# Patient Record
Sex: Male | Born: 1995 | Race: White | Hispanic: No | Marital: Single | Smoking: Current some day smoker
Health system: Southern US, Community
[De-identification: ages and names within clinical notes are randomized; demographics above are authoritative.]

## PROBLEM LIST (undated history)

## (undated) DIAGNOSIS — S060XAA Concussion with loss of consciousness status unknown, initial encounter: Secondary | ICD-10-CM

## (undated) DIAGNOSIS — S42009A Fracture of unspecified part of unspecified clavicle, initial encounter for closed fracture: Secondary | ICD-10-CM

## (undated) DIAGNOSIS — S060X9A Concussion with loss of consciousness of unspecified duration, initial encounter: Secondary | ICD-10-CM

## (undated) HISTORY — DX: Concussion with loss of consciousness of unspecified duration, initial encounter: S06.0X9A

## (undated) HISTORY — PX: CIRCUMCISION: SUR203

## (undated) HISTORY — DX: Concussion with loss of consciousness status unknown, initial encounter: S06.0XAA

## (undated) HISTORY — DX: Fracture of unspecified part of unspecified clavicle, initial encounter for closed fracture: S42.009A

---
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
## 2017-10-07 NOTE — Progress Notes (Deleted)
   Subjective:    Patient ID: Dillon Carrillo, male    DOB: 01-20-1996, 22 y.o.   MRN: [id number]  HPI:  Dillon Carrillo is here to establish as a new pt.  He is a pleasant 22 year old male.  PMH:    Review of Systems     Objective:   Physical Exam        Assessment & Plan:

## 2017-10-08 ENCOUNTER — Ambulatory Visit: Payer: 59 | Admitting: Adult Health

## 2017-11-13 ENCOUNTER — Encounter: Payer: Self-pay | Admitting: Adult Health

## 2017-11-13 ENCOUNTER — Ambulatory Visit (INDEPENDENT_AMBULATORY_CARE_PROVIDER_SITE_OTHER): Payer: No Typology Code available for payment source | Admitting: Adult Health

## 2017-11-13 VITALS — BP 120/80 | HR 57 | Ht 70.0 in | Wt 199.6 lb

## 2017-11-13 DIAGNOSIS — Z118 Encounter for screening for other infectious and parasitic diseases: Secondary | ICD-10-CM | POA: Diagnosis not present

## 2017-11-13 DIAGNOSIS — Z0000 Encounter for general adult medical examination without abnormal findings: Secondary | ICD-10-CM | POA: Insufficient documentation

## 2017-11-13 NOTE — Assessment & Plan Note (Signed)
Urine specimen collected today

## 2017-11-13 NOTE — Assessment & Plan Note (Signed)
Increase water intake, strive for at least 100 ounces/day.   Follow Heart Healthy diet Increase regular exercise.  Recommend at least 30 minutes daily, 5 days per week of walking, jogging, biking, swimming, YouTube/Pinterest workout videos. Recommend annual physical.
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
## 2017-11-13 NOTE — Patient Instructions (Addendum)
Heart-Healthy Eating Plan Heart-healthy meal planning includes:  Limiting unhealthy fats.  Increasing healthy fats.  Making other small dietary changes.  You may need to talk with your doctor or a diet specialist (dietitian) to create an eating plan that is right for you. What types of fat should I choose?  Choose healthy fats. These include olive oil and canola oil, flaxseeds, walnuts, almonds, and seeds.  Eat more omega-3 fats. These include salmon, mackerel, sardines, tuna, flaxseed oil, and ground flaxseeds. Try to eat fish at least twice each week.  Limit saturated fats. ? Saturated fats are often found in animal products, such as meats, butter, and cream. ? Plant sources of saturated fats include palm oil, palm kernel oil, and coconut oil.  Avoid foods with partially hydrogenated oils in them. These include stick margarine, some tub margarines, cookies, crackers, and other baked goods. These contain trans fats. What general guidelines do I need to follow?  Check food labels carefully. Identify foods with trans fats or high amounts of saturated fat.  Fill one half of your plate with vegetables and green salads. Eat 4-5 servings of vegetables per day. A serving of vegetables is: ? 1 cup of raw leafy vegetables. ?  cup of raw or cooked cut-up vegetables. ?  cup of vegetable juice.  Fill one fourth of your plate with whole grains. Look for the word "whole" as the first word in the ingredient list.  Fill one fourth of your plate with lean protein foods.  Eat 4-5 servings of fruit per day. A serving of fruit is: ? One medium whole fruit. ?  cup of dried fruit. ?  cup of fresh, frozen, or canned fruit. ?  cup of 100% fruit juice.  Eat more foods that contain soluble fiber. These include apples, broccoli, carrots, beans, peas, and barley. Try to get 20-30 g of fiber per day.  Eat more home-cooked food. Eat less restaurant, buffet, and fast food.  Limit or avoid  alcohol.  Limit foods high in starch and sugar.  Avoid fried foods.  Avoid frying your food. Try baking, boiling, grilling, or broiling it instead. You can also reduce fat by: ? Removing the skin from poultry. ? Removing all visible fats from meats. ? Skimming the fat off of stews, soups, and gravies before serving them. ? Steaming vegetables in water or broth.  Lose weight if you are overweight.  Eat 4-5 servings of nuts, legumes, and seeds per week: ? One serving of dried beans or legumes equals  cup after being cooked. ? One serving of nuts equals 1 ounces. ? One serving of seeds equals  ounce or one tablespoon.  You may need to keep track of how much salt or sodium you eat. This is especially true if you have high blood pressure. Talk with your doctor or dietitian to get more information. What foods can I eat? Grains Breads, including French, white, pita, wheat, raisin, rye, oatmeal, and Italian. Tortillas that are neither fried nor made with lard or trans fat. Low-fat rolls, including hotdog and hamburger buns and English muffins. Biscuits. Muffins. Waffles. Pancakes. Light popcorn. Whole-grain cereals. Flatbread. Melba toast. Pretzels. Breadsticks. Rusks. Low-fat snacks. Low-fat crackers, including oyster, saltine, matzo, graham, animal, and rye. Rice and pasta, including brown rice and pastas that are made with whole wheat. Vegetables All vegetables. Fruits All fruits, but limit coconut. Meats and Other Protein Sources Lean, well-trimmed beef, veal, pork, and lamb. Chicken and turkey without skin. All fish and shellfish.   Wild duck, rabbit, pheasant, and venison. Egg whites or low-cholesterol egg substitutes. Dried beans, peas, lentils, and tofu. Seeds and most nuts. Dairy Low-fat or nonfat cheeses, including ricotta, string, and mozzarella. Skim or 1% milk that is liquid, powdered, or evaporated. Buttermilk that is made with low-fat milk. Nonfat or low-fat  yogurt. Beverages Mineral water. Diet carbonated beverages. Sweets and Desserts Sherbets and fruit ices. Honey, jam, marmalade, jelly, and syrups. Meringues and gelatins. Pure sugar candy, such as hard candy, jelly beans, gumdrops, mints, marshmallows, and small amounts of dark chocolate. [employer] M[employer] food cake. Eat all sweets and desserts in moderation. Fats and Oils Nonhydrogenated (trans-free) margarines. Vegetable oils, including soybean, sesame, sunflower, olive, peanut, safflower, corn, canola, and cottonseed. Salad dressings or mayonnaise made with a vegetable oil. Limit added fats and oils that you use for cooking, baking, salads, and as spreads. Other Cocoa powder. Coffee and tea. All seasonings and condiments. The items listed above may not be a complete list of recommended foods or beverages. Contact your dietitian for more options. What foods are not recommended? Grains Breads that are made with saturated or trans fats, oils, or whole milk. Croissants. Butter rolls. Cheese breads. Sweet rolls. Donuts. Buttered popcorn. Chow mein noodles. High-fat crackers, such as cheese or butter crackers. Meats and Other Protein Sources Fatty meats, such as hotdogs, short ribs, sausage, spareribs, bacon, rib eye roast or steak, and mutton. High-fat deli meats, such as salami and bologna. Caviar. Domestic duck and goose. Organ meats, such as kidney, liver, sweetbreads, and heart. Dairy Cream, sour cream, cream cheese, and creamed cottage cheese. Whole-milk cheeses, including blue (bleu), [address], [place], [place], [address], [place], [address], cheddar, [place], and [place]. Whole or 2% milk that is liquid, evaporated, or condensed. Whole buttermilk. Cream sauce or high-fat cheese sauce. Yogurt that is made from whole milk. Beverages Regular sodas and juice drinks with added sugar. Sweets and Desserts Frosting. Pudding. Cookies. Cakes other than angel food cake. Candy that has milk chocolate or white  chocolate, hydrogenated fat, butter, coconut, or unknown ingredients. Buttered syrups. Full-fat ice cream or ice cream drinks. Fats and Oils Gravy that has suet, meat fat, or shortening. Cocoa butter, hydrogenated oils, palm oil, coconut oil, palm kernel oil. These can often be found in baked products, candy, fried foods, nondairy creamers, and whipped toppings. Solid fats and shortenings, including bacon fat, salt pork, lard, and butter. Nondairy cream substitutes, such as coffee creamers and sour cream substitutes. Salad dressings that are made of unknown oils, cheese, or sour cream. The items listed above may not be a complete list of foods and beverages to avoid. Contact your dietitian for more information. This information is not intended to replace advice given to you by your health care provider. Make sure you discuss any questions you have with your health care provider. Document Released: 02/18/2012 Document Revised: 01/25/2016 Document Reviewed: 02/10/2014 Elsevier Interactive Patient Education  2018 [employer]   Increase water intake, strive for at least 100 ounces/day.   Follow Heart Healthy diet Increase regular exercise.  Recommend at least 30 minutes daily, 5 days per week of walking, jogging, biking, swimming, YouTube/Pinterest workout videos. Recommend annual physical. WELCOME TO THE PRACTICE!

## 2017-11-16 LAB — CHLAMYDIA TRACHOMATIS, DNA, AMP PROBE: Chlamydia trachomatis, NAA: NEGATIVE

## 2017-11-17 NOTE — Progress Notes (Signed)
Pt called but the call was not able to go through because the number could not be reach...wil retry later. Idelle JoW. Linus Weckerly, CMA

## 2017-12-02 ENCOUNTER — Encounter: Payer: Self-pay | Admitting: Adult Health

## 2017-12-02 ENCOUNTER — Ambulatory Visit (INDEPENDENT_AMBULATORY_CARE_PROVIDER_SITE_OTHER): Payer: No Typology Code available for payment source | Admitting: Adult Health

## 2017-12-02 ENCOUNTER — Other Ambulatory Visit: Payer: Self-pay | Admitting: Adult Health

## 2017-12-02 VITALS — BP 107/67 | HR 68 | Temp 97.8°F | Ht 70.0 in | Wt 196.1 lb

## 2017-12-02 DIAGNOSIS — R1011 Right upper quadrant pain: Secondary | ICD-10-CM | POA: Insufficient documentation

## 2017-12-02 DIAGNOSIS — Z7251 High risk heterosexual behavior: Secondary | ICD-10-CM

## 2017-12-02 DIAGNOSIS — R1013 Epigastric pain: Secondary | ICD-10-CM

## 2017-12-02 NOTE — Assessment & Plan Note (Signed)
Drawn today: CBC STI screening US Abdomen complete Instructed to seek immediate medical care if "Red Flag" sx's develop.

## 2017-12-02 NOTE — Progress Notes (Addendum)
Subjective:    Patient ID: Dillon Carrillo, male    DOB: [birth year], 22 y.o.   MRN: [id number]  HPI:  Dillon Carrillo presents with abdominal pain that developed yesterday morning.  Pain was initially epigastric and is now RUQ/RLQ and is constant, described as a "pulled muscle feeling" and rated 2/10.  He denies fever/night sweats/N/V/D/poor appetite. He denies change in bowel/bladder habits, consistently having at least one BM daily. He denies blood in urine/stool. He reports last unprotected sexual encounter was Nov 2018. He denies inguinal pain, penile discharge, or dysuria. He has not taken anything for pain. He denies any abdominal surgeries.  Patient Care Team    Relationship Specialty Notifications Start End  Julaine Fusi, NP PCP - General Family Medicine  11/13/17     Patient Active Problem List   Diagnosis Date Noted  . Screening for chlamydial disease 11/13/2017  . Healthcare maintenance 11/13/2017     Past Medical History:  Diagnosis Date  . Clavicle fracture    left  . Concussion      Past Surgical History:  Procedure Laterality Date  . CIRCUMCISION       Family History  Problem Relation Age of Onset  . Hypertension Mother   . Healthy Father   . Healthy Sister   . Healthy Brother   . Healthy Brother      Social History   Substance and Sexual Activity  Drug Use Yes  . Types: Marijuana     Social History   Substance and Sexual Activity  Alcohol Use Yes   Comment: every couple of months     Social History   Tobacco Use  Smoking Status Never Smoker  Smokeless Tobacco Never Used     No outpatient encounter medications on file as of 12/02/2017.   No facility-administered encounter medications on file as of 12/02/2017.     Allergies: Penicillins  Body mass index is 28.14 kg/m.  Blood pressure 107/67, pulse 68, temperature 97.8 F (36.6 C), temperature source Oral, height 5\' 10"  (1.778 m), weight 196 lb 1.6 oz  (89 kg), SpO2 100 %.     Review of Systems  Constitutional: Negative for activity change, appetite change, chills, diaphoresis, fatigue, fever and unexpected weight change.  Eyes: Negative for visual disturbance.  Respiratory: Negative for cough, chest tightness, shortness of breath, wheezing and stridor.   Cardiovascular: Negative for chest pain, palpitations and leg swelling.  Gastrointestinal: Positive for abdominal pain. Negative for abdominal distention, blood in stool, constipation, diarrhea, nausea and vomiting.  Endocrine: Negative for cold intolerance, heat intolerance, polydipsia, polyphagia and polyuria.  Genitourinary: Negative for decreased urine volume, difficulty urinating, discharge, flank pain, frequency, genital sores, hematuria, penile pain, penile swelling, scrotal swelling, testicular pain and urgency.  Neurological: Positive for headaches. Negative for dizziness.       Objective:   Physical Exam  Constitutional: He is oriented to person, place, and time. He appears well-developed and well-nourished. No distress.  HENT:  Head: Normocephalic and atraumatic.  Right Ear: External ear normal.  Left Ear: External ear normal.  Eyes: Pupils are equal, round, and reactive to light. Conjunctivae are normal.  Cardiovascular: Normal rate, regular rhythm, normal heart sounds and intact distal pulses.  No murmur heard. Pulmonary/Chest: Effort normal and breath sounds normal. No respiratory distress. He has no wheezes. He has no rales. He exhibits no tenderness.  Abdominal: Soft. Bowel sounds are normal. He exhibits no distension and no mass. There is no hepatosplenomegaly. There is tenderness  in the right upper quadrant, right lower quadrant and epigastric area. There is no rigidity, no rebound, no guarding, no CVA tenderness, no tenderness at McBurney's point and negative Murphy's sign.  Neurological: He is alert and oriented to person, place, and time.  Skin: Skin is warm and  dry. No rash noted. He is not diaphoretic. No erythema. No pallor.  Psychiatric: He has a normal mood and affect. His behavior is normal. Judgment and thought content normal.  Nursing note and vitals reviewed.     Assessment & Plan:   1. High risk heterosexual behavior   2. Epigastric abdominal pain   3. RUQ pain     RUQ pain Drawn today: CBC STI screening US Abdomen complete Instructed to seek immediate medical care if "Red Flag" sx's develop.  Epigastric abdominal pain Drawn today: CBC STI screening US Abdomen complete Instructed to seek immediate medical care if "Red Flag" sx's develop.    FOLLOW-UP:  Return if symptoms worsen or fail to improve. Pt was in the office today for 25+ minutes, with over 50% time spent in face to face counseling of patient's various medical conditions and in coordination of care

## 2017-12-02 NOTE — Assessment & Plan Note (Signed)
Drawn today: CBC STI screening US Abdomen complete Instructed to seek immediate medical care if "Red Flag" sx's develop. 

## 2017-12-02 NOTE — Patient Instructions (Signed)
Abdominal Pain, Adult Abdominal pain can be caused by many things. Often, abdominal pain is not serious and it gets better with no treatment or by being treated at home. However, sometimes abdominal pain is serious. Your health care provider will do a medical history and a physical exam to try to determine the cause of your abdominal pain. Follow these instructions at home:  Take over-the-counter and prescription medicines only as told by your health care provider. Do not take a laxative unless told by your health care provider.  Drink enough fluid to keep your urine clear or pale yellow.  Watch your condition for any changes.  Keep all follow-up visits as told by your health care provider. This is important. Contact a health care provider if:  Your abdominal pain changes or gets worse.  You are not hungry or you lose weight without trying.  You are constipated or have diarrhea for more than 2-3 days.  You have pain when you urinate or have a bowel movement.  Your abdominal pain wakes you up at night.  Your pain gets worse with meals, after eating, or with certain foods.  You are throwing up and cannot keep anything down.  You have a fever. Get help right away if:  Your pain does not go away as soon as your health care provider told you to expect.  You cannot stop throwing up.  Your pain is only in areas of the abdomen, such as the right side or the left lower portion of the abdomen.  You have bloody or black stools, or stools that look like tar.  You have severe pain, cramping, or bloating in your abdomen.  You have signs of dehydration, such as: ? Dark urine, very little urine, or no urine. ? Cracked lips. ? Dry mouth. ? Sunken eyes. ? Sleepiness. ? Weakness. This information is not intended to replace advice given to you by your health care provider. Make sure you discuss any questions you have with your health care provider. Document Released: 05/29/2005 Document  Revised: 03/08/2016 Document Reviewed: 01/31/2016 Elsevier Interactive Patient Education  [employer]2018 Elsevier Inc.   We will call you when lab results are available. Someone will call you to schedule abdominal ultrasound. Please call clinic with any questions/concerns. FEEL BETTER!

## 2017-12-03 LAB — HIV ANTIBODY (ROUTINE TESTING W REFLEX): HIV Screen 4th Generation wRfx: NONREACTIVE

## 2017-12-03 LAB — CBC WITH DIFFERENTIAL/PLATELET
BASOS ABS: 0 10*3/uL (ref 0.0–0.2)
Basos: 0 %
EOS (ABSOLUTE): 0.1 10*3/uL (ref 0.0–0.4)
Eos: 2 %
HEMOGLOBIN: 14.7 g/dL (ref 13.0–17.7)
Hematocrit: 42.6 % (ref 37.5–51.0)
IMMATURE GRANS (ABS): 0 10*3/uL (ref 0.0–0.1)
IMMATURE GRANULOCYTES: 0 %
LYMPHS: 41 %
Lymphocytes Absolute: 2.1 10*3/uL (ref 0.7–3.1)
MCH: 31.7 pg (ref 26.6–33.0)
MCHC: 34.5 g/dL (ref 31.5–35.7)
MCV: 92 fL (ref 79–97)
MONOCYTES: 11 %
Monocytes Absolute: 0.5 10*3/uL (ref 0.1–0.9)
NEUTROS ABS: 2.4 10*3/uL (ref 1.4–7.0)
NEUTROS PCT: 46 %
PLATELETS: 251 10*3/uL (ref 150–379)
RBC: 4.63 x10E6/uL (ref 4.14–5.80)
RDW: 13.6 % (ref 12.3–15.4)
WBC: 5.1 10*3/uL (ref 3.4–10.8)

## 2017-12-03 LAB — RPR: RPR: NONREACTIVE

## 2017-12-04 ENCOUNTER — Ambulatory Visit
Admission: RE | Admit: 2017-12-04 | Discharge: 2017-12-04 | Disposition: A | Discharge status: Home / Self Care | Payer: No Typology Code available for payment source | Source: Ambulatory Visit | Attending: Adult Health | Admitting: Adult Health

## 2017-12-04 DIAGNOSIS — R1011 Right upper quadrant pain: Secondary | ICD-10-CM | POA: Insufficient documentation

## 2017-12-04 DIAGNOSIS — R1013 Epigastric pain: Secondary | ICD-10-CM | POA: Diagnosis not present

## 2017-12-04 LAB — CHLAMYDIA/GONOCOCCUS/TRICHOMONAS, NAA
CHLAMYDIA BY NAA: NEGATIVE
GONOCOCCUS BY NAA: NEGATIVE
TRICH VAG BY NAA: NEGATIVE

## 2018-01-09 ENCOUNTER — Encounter: Payer: Self-pay | Admitting: Family Medicine

## 2018-01-09 ENCOUNTER — Ambulatory Visit (INDEPENDENT_AMBULATORY_CARE_PROVIDER_SITE_OTHER): Payer: Self-pay | Admitting: Family Medicine

## 2018-01-09 VITALS — BP 120/60 | HR 86 | Temp 97.8°F | Wt 198.0 lb

## 2018-01-09 DIAGNOSIS — J9801 Acute bronchospasm: Secondary | ICD-10-CM

## 2018-01-09 DIAGNOSIS — J019 Acute sinusitis, unspecified: Secondary | ICD-10-CM

## 2018-01-09 MED ORDER — ALBUTEROL SULFATE 108 (90 BASE) MCG/ACT IN AEPB: 2.0000 | INHALATION_SPRAY | Freq: Four times a day (QID) | RESPIRATORY_TRACT | 0 refills | Status: DC | PRN

## 2018-01-09 MED ORDER — DOXYCYCLINE HYCLATE 100 MG PO CAPS: 100.0000 mg | ORAL_CAPSULE | Freq: Two times a day (BID) | ORAL | 0 refills | Status: DC

## 2018-01-09 MED ORDER — LEVOCETIRIZINE DIHYDROCHLORIDE 5 MG PO TABS: 5.0000 mg | ORAL_TABLET | Freq: Every evening | ORAL | 0 refills | Status: DC

## 2018-01-09 MED ORDER — BENZONATATE 100 MG PO CAPS: 100.0000 mg | ORAL_CAPSULE | Freq: Three times a day (TID) | ORAL | 0 refills | Status: DC | PRN

## 2018-01-09 NOTE — Progress Notes (Signed)
Patient ID: Dillon Carrillo, male    DOB: 1996/06/08, 22 y.o.   MRN: [id number]  PCP: Julaine Fusi, NP  Chief Complaint  Patient presents with  . focus-cough    Subjective:  HPI Dillon Carrillo is a 22 y.o. male presents for evaluation worsening cough x 3 weeks. Cyle is a current smoker. Reports associated symptoms include throat discomfort, nasal drainage and congestion, and non-productive persistent cough. Reports no known history of asthma or allergies. His profession requires him to work outside daily and he feels this is exacerbated symptoms. He starting taking an allergy medication yesterday which hasn't improved symptoms. Denies fever, dizziness, chest tightness, or body aches. Social History   Socioeconomic History  . Marital status: Single    Spouse name: Not on file  . Number of children: Not on file  . Years of education: Not on file  . Highest education level: Not on file  Occupational History  . Not on file  Social Needs  . Financial resource strain: Not on file  . Food insecurity:    Worry: Not on file    Inability: Not on file  . Transportation needs:    Medical: Not on file    Non-medical: Not on file  Tobacco Use  . Smoking status: Never Smoker  . Smokeless tobacco: Never Used  Substance and Sexual Activity  . Alcohol use: Yes    Comment: every couple of months  . Drug use: Yes    Types: Marijuana  . Sexual activity: Not Currently  Lifestyle  . Physical activity:    Days per week: Not on file    Minutes per session: Not on file  . Stress: Not on file  Relationships  . Social connections:    Talks on phone: Not on file    Gets together: Not on file    Attends religious service: Not on file    Active member of club or organization: Not on file    Attends meetings of clubs or organizations: Not on file    Relationship status: Not on file  . Intimate partner violence:    Fear of current or ex partner: Not on file   Emotionally abused: Not on file    Physically abused: Not on file    Forced sexual activity: Not on file  Other Topics Concern  . Not on file  Social History Narrative  . Not on file    Family History  Problem Relation Age of Onset  . Hypertension Mother   . Healthy Father   . Healthy Sister   . Healthy Brother   . Healthy Brother    Review of Systems  Pertinent negatives listed in HPI   Patient Active Problem List   Diagnosis Date Noted  . Epigastric abdominal pain 12/02/2017  . RUQ pain 12/02/2017  . Screening for chlamydial disease 11/13/2017  . Healthcare maintenance 11/13/2017    Allergies  Allergen Reactions  . Penicillins Rash and Swelling    fever     Prior to Admission medications   Not on File    Past Medical, Surgical Family and Social History reviewed and updated.    Objective:   Today's Vitals   01/09/18 1048  BP: 120/60  Pulse: 86  Temp: 97.8 F (36.6 C)  SpO2: 98%  Weight: 198 lb (89.8 kg)    Wt Readings from Last 3 Encounters:  01/09/18 198 lb (89.8 kg) (92 %, Z= 1.38)*  12/02/17 196 lb 1.6 oz (89 kg) (  91 %, Z= 1.34)*  11/13/17 199 lb 9.6 oz (90.5 kg) (92 %, Z= 1.43)*   * Growth percentiles are based on CDC (Boys, 2-20 Years) data.   Physical Exam  Constitutional: He is oriented to person, place, and time. He appears well-developed and well-nourished.  HENT:  Head: Normocephalic and atraumatic.  Right Ear: External ear normal.  Left Ear: External ear normal.  Nose: Mucosal edema and rhinorrhea present.  Mouth/Throat: Posterior oropharyngeal erythema present. No oropharyngeal exudate, posterior oropharyngeal edema or tonsillar abscesses.  Cardiovascular: Normal rate, regular rhythm, normal heart sounds and intact distal pulses.  Pulmonary/Chest: Effort normal and breath sounds normal.  Neurological: He is alert and oriented to person, place, and time.  Psychiatric: He has a normal mood and affect. His behavior is normal. Judgment  and thought content normal.   Assessment & Plan:  1. Acute sinusitis, recurrence not specified, unspecified location 2. Cough due to bronchospasm Start Levocetirizine 5 mg daily at bedtime for allergy management. Take Doxycycline 100 mg twice daily x 10 day to treat sinusitis. 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 You may also take Benzonatate 100-200 mg up to 3 times daily as needed for cough. Strongly encouraged smoking cessation.   Meds ordered this encounter  Medications  . doxycycline (VIBRAMYCIN) 100 MG capsule    Sig: Take 1 capsule (100 mg total) by mouth 2 (two) times daily.    Dispense:  20 capsule    Refill:  0  . Albuterol Sulfate (PROAIR RESPICLICK) 108 (90 Base) MCG/ACT AEPB    Sig: Inhale 2 puffs into the lungs every 6 (six) hours as needed (cough, shortness of breath, wheezing).    Dispense:  1 each    Refill:  0  . levocetirizine (XYZAL) 5 MG tablet    Sig: Take 1 tablet (5 mg total) by mouth every evening.    Dispense:  60 tablet    Refill:  0  . benzonatate (TESSALON) 100 MG capsule    Sig: Take 1-2 capsules (100-200 mg total) by mouth 3 (three) times daily as needed for cough.    Dispense:  40 capsule    Refill:  0     If symptoms worsen or do not improve, return for follow-up, follow-up with PCP, or at the emergency department if severity of symptoms warrant a higher level of care.     Godfrey Pick. Tiburcio Pea, MSN, FNP-C [facility]  [address]  [place], [place] [address] [phone]

## 2018-01-09 NOTE — Patient Instructions (Signed)
Take all medication as directed. I recommend that you discontinue smoking as this will only worsen your symptoms. Follow-up with your PCP if symptoms worsen or do not improve.    Cough, Adult A cough helps to clear your throat and lungs. A cough may last only 2-3 weeks (acute), or it may last longer than 8 weeks (chronic). Many different things can cause a cough. A cough may be a sign of an illness or another medical condition. Follow these instructions at home:  Pay attention to any changes in your cough.  Take medicines only as told by your doctor. ? If you were prescribed an antibiotic medicine, take it as told by your doctor. Do not stop taking it even if you start to feel better. ? Talk with your doctor before you try using a cough medicine.  Drink enough fluid to keep your pee (urine) clear or pale yellow.  If the air is dry, use a cold steam vaporizer or humidifier in your home.  Stay away from things that make you cough at work or at home.  If your cough is worse at night, try using extra pillows to raise your head up higher while you sleep.  Do not smoke, and try not to be around smoke. If you need help quitting, ask your doctor.  Do not have caffeine.  Do not drink alcohol.  Rest as needed. Contact a doctor if:  You have new problems (symptoms).  You cough up yellow fluid (pus).  Your cough does not get better after 2-3 weeks, or your cough gets worse.  Medicine does not help your cough and you are not sleeping well.  You have pain that gets worse or pain that is not helped with medicine.  You have a fever.  You are losing weight and you do not know why.  You have night sweats. Get help right away if:  You cough up blood.  You have trouble breathing.  Your heartbeat is very fast. This information is not intended to replace advice given to you by your health care provider. Make sure you discuss any questions you have with your health care  provider. Document Released: 05/02/2011 Document Revised: 01/25/2016 Document Reviewed: 10/26/2014 Elsevier Interactive Patient Education  2018 [employer].     Sinusitis, Adult Sinusitis is soreness and inflammation of your sinuses. Sinuses are hollow spaces in the bones around your face. Your sinuses are located:  Around your eyes.  In the middle of your forehead.  Behind your nose.  In your cheekbones.  Your sinuses and nasal passages are lined with a stringy fluid (mucus). Mucus normally drains out of your sinuses. When your nasal tissues become inflamed or swollen, the mucus can become trapped or blocked so air cannot flow through your sinuses. This allows bacteria, viruses, and funguses to grow, which leads to infection. Sinusitis can develop quickly and last for 7?10 days (acute) or for more than 12 weeks (chronic). Sinusitis often develops after a cold. What are the causes? This condition is caused by anything that creates swelling in the sinuses or stops mucus from draining, including:  Allergies.  Asthma.  Bacterial or viral infection.  Abnormally shaped bones between the nasal passages.  Nasal growths that contain mucus (nasal polyps).  Narrow sinus openings.  Pollutants, such as chemicals or irritants in the air.  A foreign object stuck in the nose.  A fungal infection. This is rare.  What increases the risk? The following factors may make you more  likely to develop this condition:  Having allergies or asthma.  Having had a recent cold or respiratory tract infection.  Having structural deformities or blockages in your nose or sinuses.  Having a weak immune system.  Doing a lot of swimming or diving.  Overusing nasal sprays.  Smoking.  What are the signs or symptoms? The main symptoms of this condition are pain and a feeling of pressure around the affected sinuses. Other symptoms include:  Upper toothache.  Earache.  Headache.  Bad  breath.  Decreased sense of smell and taste.  A cough that may get worse at night.  Fatigue.  Fever.  Thick drainage from your nose. The drainage is often green and it may contain pus (purulent).  Stuffy nose or congestion.  Postnasal drip. This is when extra mucus collects in the throat or back of the nose.  Swelling and warmth over the affected sinuses.  Sore throat.  Sensitivity to light.  How is this diagnosed? This condition is diagnosed based on symptoms, a medical history, and a physical exam. To find out if your condition is acute or chronic, your health care provider may:  Look in your nose for signs of nasal polyps.  Tap over the affected sinus to check for signs of infection.  View the inside of your sinuses using an imaging device that has a light attached (endoscope).  If your health care provider suspects that you have chronic sinusitis, you may also:  Be tested for allergies.  Have a sample of mucus taken from your nose (nasal culture) and checked for bacteria.  Have a mucus sample examined to see if your sinusitis is related to an allergy.  If your sinusitis does not respond to treatment and it lasts longer than 8 weeks, you may have an MRI or CT scan to check your sinuses. These scans also help to determine how severe your infection is. In rare cases, a bone biopsy may be done to rule out more serious types of fungal sinus disease. How is this treated? Treatment for sinusitis depends on the cause and whether your condition is chronic or acute. If a virus is causing your sinusitis, your symptoms will go away on their own within 10 days. You may be given medicines to relieve your symptoms, including:  Topical nasal decongestants. They shrink swollen nasal passages and let mucus drain from your sinuses.  Antihistamines. These drugs block inflammation that is triggered by allergies. This can help to ease swelling in your nose and sinuses.  Topical nasal  corticosteroids. These are nasal sprays that ease inflammation and swelling in your nose and sinuses.  Nasal saline washes. These rinses can help to get rid of thick mucus in your nose.  If your condition is caused by bacteria, you will be given an antibiotic medicine. If your condition is caused by a fungus, you will be given an antifungal medicine. Surgery may be needed to correct underlying conditions, such as narrow nasal passages. Surgery may also be needed to remove polyps. Follow these instructions at home: Medicines  Take, use, or apply over-the-counter and prescription medicines only as told by your health care provider. These may include nasal sprays.  If you were prescribed an antibiotic medicine, take it as told by your health care provider. Do not stop taking the antibiotic even if you start to feel better. Hydrate and Humidify  Drink enough water to keep your urine clear or pale yellow. Staying hydrated will help to thin your mucus.  Use a cool mist humidifier to keep the humidity level in your home above 50%.  Inhale steam for 10-15 minutes, 3-4 times a day or as told by your health care provider. You can do this in the bathroom while a hot shower is running.  Limit your exposure to cool or dry air. Rest  Rest as much as possible.  Sleep with your head raised (elevated).  Make sure to get enough sleep each night. General instructions  Apply a warm, moist washcloth to your face 3-4 times a day or as told by your health care provider. This will help with discomfort.  Wash your hands often with soap and water to reduce your exposure to viruses and other germs. If soap and water are not available, use hand sanitizer.  Do not smoke. Avoid being around people who are smoking (secondhand smoke).  Keep all follow-up visits as told by your health care provider. This is important. Contact a health care provider if:  You have a fever.  Your symptoms get worse.  Your  symptoms do not improve within 10 days. Get help right away if:  You have a severe headache.  You have persistent vomiting.  You have pain or swelling around your face or eyes.  You have vision problems.  You develop confusion.  Your neck is stiff.  You have trouble breathing. This information is not intended to replace advice given to you by your health care provider. Make sure you discuss any questions you have with your health care provider. Document Released: 08/19/2005 Document Revised: 04/14/2016 Document Reviewed: 06/14/2015 Elsevier Interactive Patient Education  [employer].

## 2018-01-12 ENCOUNTER — Telehealth: Payer: Self-pay | Admitting: Emergency Medicine

## 2018-01-13 NOTE — Telephone Encounter (Signed)
Telephone number has restrictions couldn't leave message

## 2018-01-19 ENCOUNTER — Encounter: Payer: Self-pay | Admitting: Adult Health

## 2018-01-19 ENCOUNTER — Ambulatory Visit (INDEPENDENT_AMBULATORY_CARE_PROVIDER_SITE_OTHER): Payer: No Typology Code available for payment source | Admitting: Adult Health

## 2018-01-19 VITALS — BP 97/59 | HR 91 | Temp 98.1°F | Ht 70.0 in | Wt 195.1 lb

## 2018-01-19 DIAGNOSIS — Z0000 Encounter for general adult medical examination without abnormal findings: Secondary | ICD-10-CM | POA: Diagnosis not present

## 2018-01-19 DIAGNOSIS — J209 Acute bronchitis, unspecified: Secondary | ICD-10-CM | POA: Diagnosis not present

## 2018-01-19 MED ORDER — AZITHROMYCIN 250 MG PO TABS: ORAL_TABLET | ORAL | 0 refills | Status: DC

## 2018-01-19 NOTE — Assessment & Plan Note (Signed)
Increase water intake, strive for at least 120 ounces/day.   Follow Heart Healthy diet Recommend annual physical, sooner if needed.

## 2018-01-19 NOTE — Patient Instructions (Signed)

## 2018-01-19 NOTE — Progress Notes (Signed)
Subjective:    Patient ID: Dillon Carrillo, male    DOB: Apr 30, 1996, 22 y.o.   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  Patient Care Team    Relationship Specialty Notifications Start End  Julaine Fusi, NP PCP - General Family Medicine  11/13/17     Patient Active Problem List   Diagnosis Date Noted  . Acute bronchitis 01/19/2018  . Epigastric abdominal pain 12/02/2017  . RUQ pain 12/02/2017  . Screening for chlamydial disease 11/13/2017  . Healthcare maintenance 11/13/2017     Past Medical History:  Diagnosis Date  . Clavicle fracture    left  . Concussion      Past Surgical History:  Procedure Laterality Date  . CIRCUMCISION       Family History  Problem Relation Age of Onset  . Hypertension Mother   . Healthy Father   . Healthy Sister   . Healthy Brother   . Healthy Brother      Social History   Substance and Sexual Activity  Drug Use Yes  . Types: Marijuana     Social History   Substance and Sexual Activity  Alcohol Use Yes   Comment: every couple of months     Social History   Tobacco Use  Smoking Status Current Some Day Smoker  Smokeless Tobacco Never Used     Outpatient Encounter Medications as of 01/19/2018  Medication Sig  . Albuterol Sulfate (PROAIR RESPICLICK) 108 (90 Base) MCG/ACT AEPB Inhale 2 puffs into the lungs every 6 (six) hours as needed (cough, shortness of breath, wheezing).  . benzonatate (TESSALON) 100 MG capsule Take  1-2 capsules (100-200 mg total) by mouth 3 (three) times daily as needed for cough.  . levocetirizine (XYZAL) 5 MG tablet Take 1 tablet (5 mg total) by mouth every evening.  Marland Kitchen azithromycin (ZITHROMAX) 250 MG tablet 2 tabs day one.  1 tab days two-five  . [DISCONTINUED] doxycycline (VIBRAMYCIN) 100 MG capsule Take 1 capsule (100 mg total) by mouth 2 (two) times daily.   No facility-administered encounter medications on file as of 01/19/2018.     Allergies: Penicillins  Body mass index is 27.99 kg/m.  Blood pressure (!) 97/59, pulse 91, temperature 98.1 F (36.7 C), temperature source Oral, height  (1.778 m), weight 195 lb 1.6 oz (88.5 kg), SpO2 95 %.  Review of Systems  Constitutional: Negative for activity change, appetite change, chills, diaphoresis, fatigue, fever and unexpected weight change.  HENT: Positive for congestion and postnasal drip. Negative for rhinorrhea, sinus pressure, sinus pain, sneezing, trouble swallowing and voice change.   Respiratory: Positive for cough. Negative for chest tightness, shortness of breath, wheezing and stridor.   Cardiovascular: Negative for chest pain, palpitations and leg swelling.  Gastrointestinal: Negative for abdominal distention, abdominal pain, blood in stool, constipation, diarrhea, nausea and vomiting.  Endocrine: Negative for cold intolerance, heat intolerance, polydipsia, polyphagia and polyuria.  Genitourinary: Negative for difficulty urinating and flank pain.  Musculoskeletal:  Negative for arthralgias, back pain, gait problem, joint swelling, myalgias, neck pain and neck stiffness.  Skin: Negative for color change, pallor, rash and wound.  Neurological: Negative for dizziness and headaches.  Psychiatric/Behavioral: Negative for confusion, decreased concentration, dysphoric mood, hallucinations, self-injury, sleep disturbance and suicidal ideas. The patient is not nervous/anxious and is not hyperactive.        Objective:    Physical Exam  Constitutional: He is oriented to person, place, and time. He appears well-developed and well-nourished. No distress.  HENT:  Head: Normocephalic and atraumatic.  Right Ear: External ear normal. Tympanic membrane is not erythematous and not bulging. No decreased hearing is noted.  Left Ear: External ear normal. Tympanic membrane is not erythematous and not bulging. No decreased hearing is noted.  Nose: Mucosal edema and rhinorrhea present. Right sinus exhibits no maxillary sinus tenderness and no frontal sinus tenderness. Left sinus exhibits no maxillary sinus tenderness and no frontal sinus tenderness.  Mouth/Throat: Mucous membranes are normal. He does not have dentures. Normal dentition. No oropharyngeal exudate, posterior oropharyngeal edema, posterior oropharyngeal erythema or tonsillar abscesses.  Eyes: Pupils are equal, round, and reactive to light. Conjunctivae and EOM are normal.  Neck: Normal range of motion. Neck supple.  Cardiovascular: Normal rate, regular rhythm, normal heart sounds and intact distal pulses. Exam reveals no gallop and no friction rub.  No murmur heard. Pulmonary/Chest: Effort normal and breath sounds normal. No stridor. No respiratory distress. He has no decreased breath sounds. He has no wheezes. He has no rhonchi. He has no rales. He exhibits no tenderness.  Constant non-productive noted during OV  Abdominal: Soft. Bowel sounds are normal. He exhibits no distension and no mass. There is no tenderness. There is no rebound and no guarding. No hernia.  Musculoskeletal: Normal range of motion.  Lymphadenopathy:    He has no cervical adenopathy.  Neurological: He is alert and oriented to person, place, and time. He displays normal reflexes.  Skin: Skin is warm and dry. Capillary refill takes less than 2 seconds. No rash noted. He is not diaphoretic. No erythema. No pallor.  Psychiatric: He has a normal mood and affect. His behavior is normal. Judgment  and thought content normal.      Assessment & Plan:   1. Healthcare maintenance   2. Acute bronchitis, unspecified organism     Healthcare maintenance Increase water intake, strive for at least 120 ounces/day.   Follow Heart Healthy diet Recommend annual physical, sooner if needed.  Acute bronchitis Please stop Doxycycline and start Azithromycin. If symptoms persist after ABX completed, then please call clinic.   FOLLOW-UP:  Return in about 1 year (around 01/20/2019) for CPE.

## 2018-01-19 NOTE — Assessment & Plan Note (Signed)
Please stop Doxycycline and start Azithromycin. If symptoms persist after ABX completed, then please call clinic.

## 2018-01-28 ENCOUNTER — Encounter: Payer: No Typology Code available for payment source | Admitting: Adult Health

## 2019-04-08 ENCOUNTER — Other Ambulatory Visit: Payer: Self-pay

## 2019-04-08 ENCOUNTER — Telehealth: Payer: Self-pay | Admitting: Adult Health

## 2019-04-08 DIAGNOSIS — Z20822 Contact with and (suspected) exposure to covid-19: Secondary | ICD-10-CM

## 2019-04-08 DIAGNOSIS — Z20828 Contact with and (suspected) exposure to other viral communicable diseases: Secondary | ICD-10-CM

## 2019-04-08 NOTE — Telephone Encounter (Signed)
Patient in need of COVID test prior to being able to return to work. Patient had a sick episode due to food but because of some of his symptoms his employer will not let him back without a neg test. Can we place this order, please advise if not because I have instructed patient to go tomorrow when they reopen.

## 2019-04-09 ENCOUNTER — Other Ambulatory Visit: Payer: Self-pay | Admitting: *Deleted

## 2019-04-09 DIAGNOSIS — Z20822 Contact with and (suspected) exposure to covid-19: Secondary | ICD-10-CM

## 2019-04-10 LAB — NOVEL CORONAVIRUS, NAA: SARS-CoV-2, NAA: NOT DETECTED

## 2019-06-25 IMAGING — US US ABDOMEN COMPLETE
1 series · 14 of 25 positions shown · non-contrast
Comparison: None.

CLINICAL DATA: Epigastric abdominal pain.

EXAM:
ABDOMEN ULTRASOUND COMPLETE

[Series 1: us abdomen complete · 0.22mm/px · 14 of 107 slices shown]
[im 1/107]
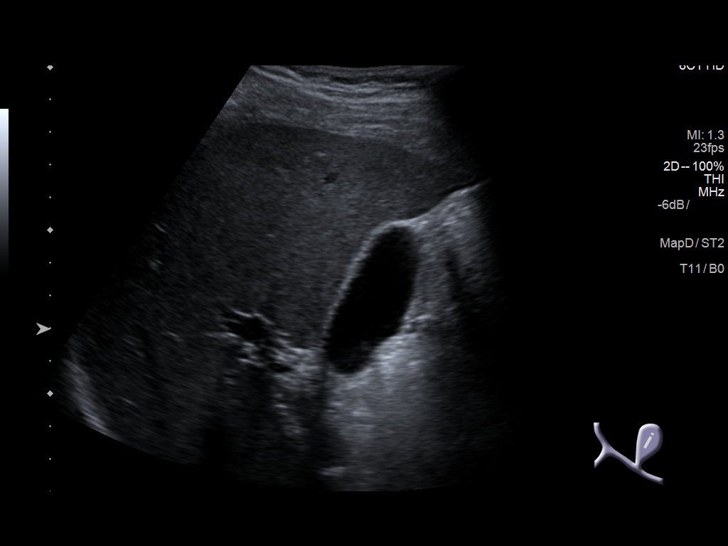
[im 9/107]
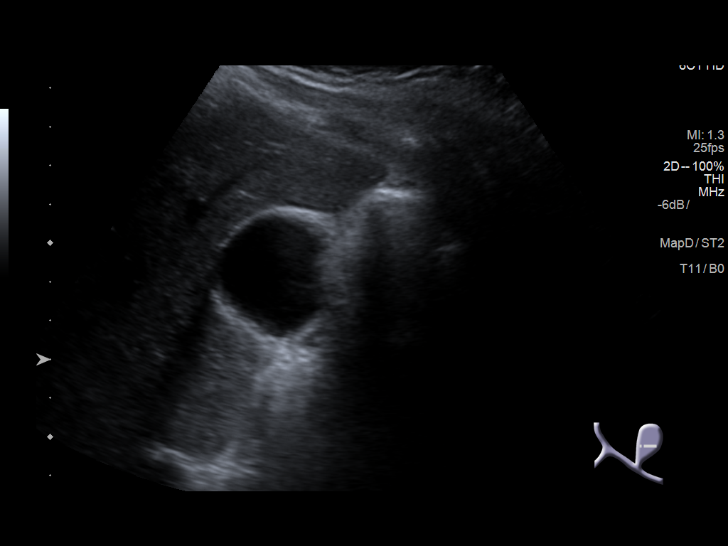
[im 18/107]
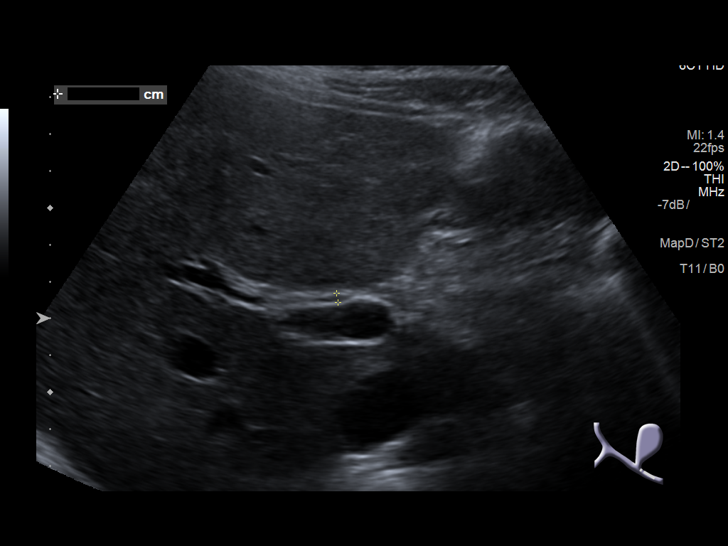
[im 27/107]
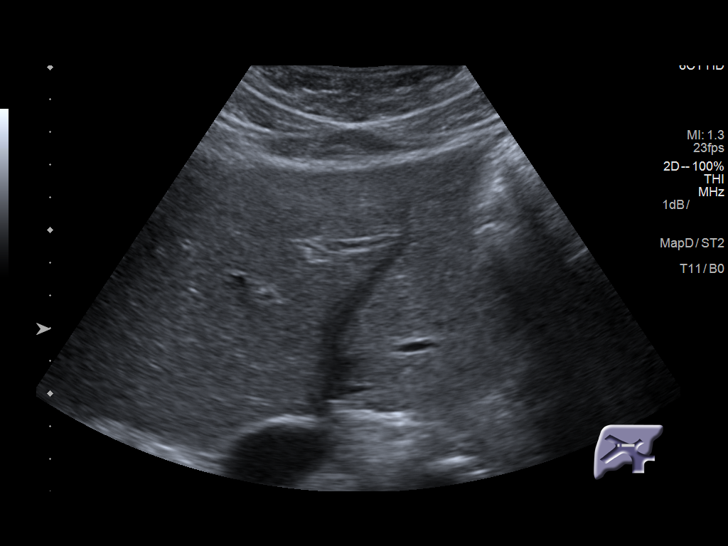
[im 36/107]
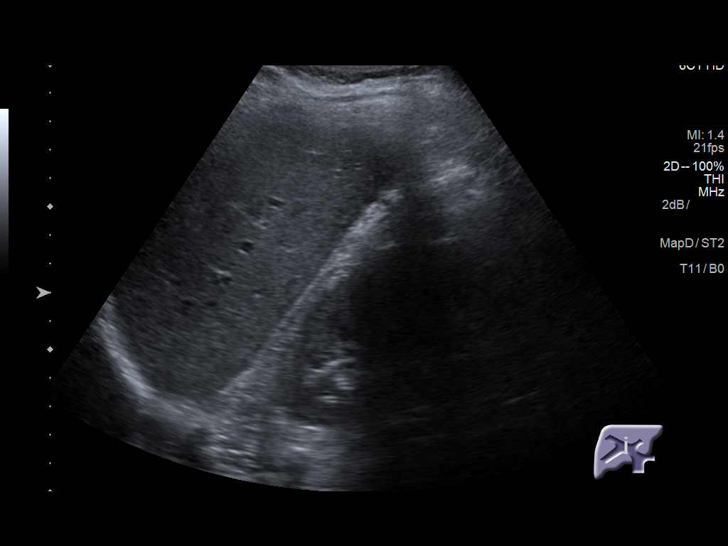
[im 40/107]
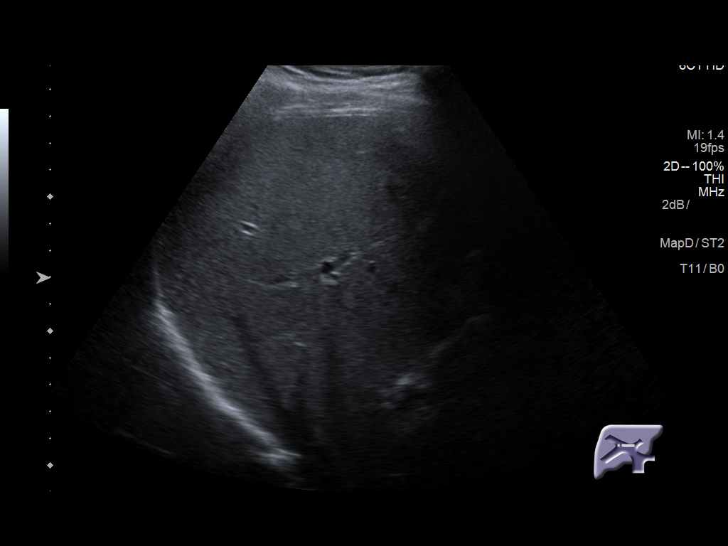
[im 49/107]
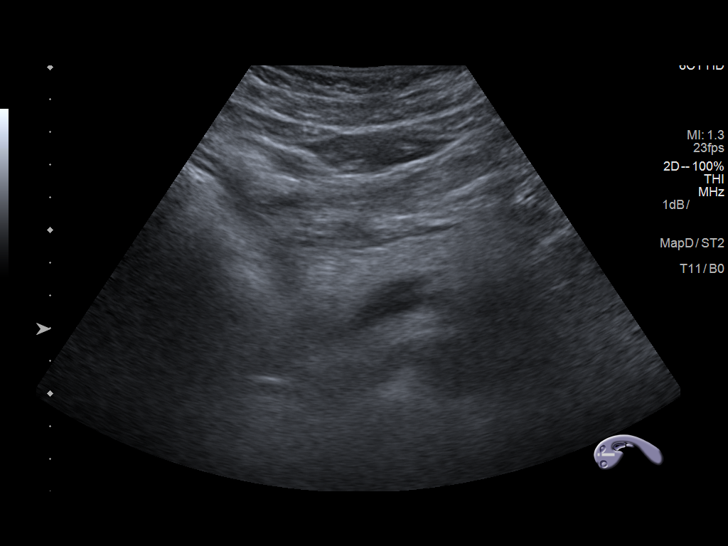
[im 58/107]
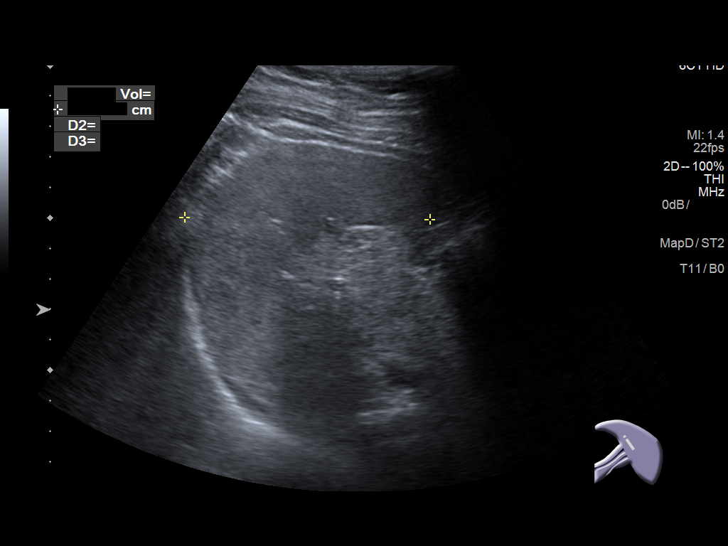
[im 67/107]
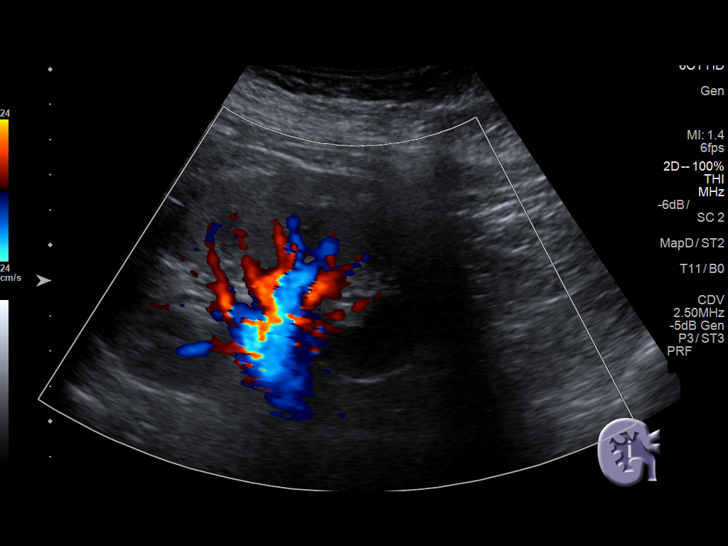
[im 71/107]
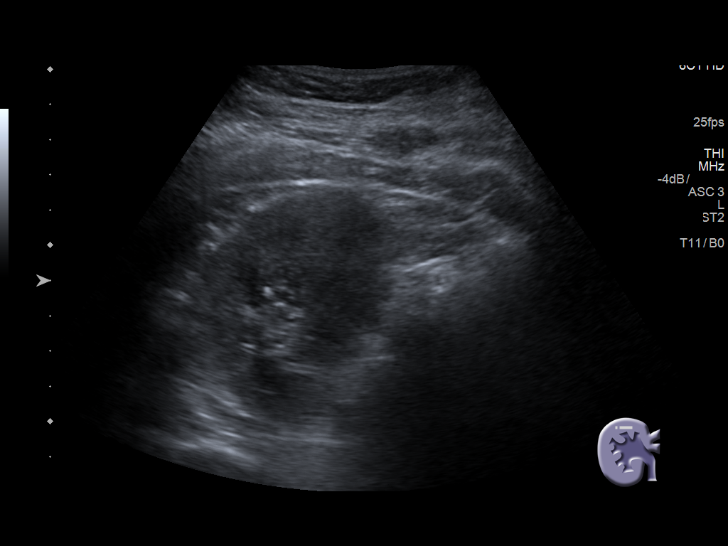
[im 80/107]
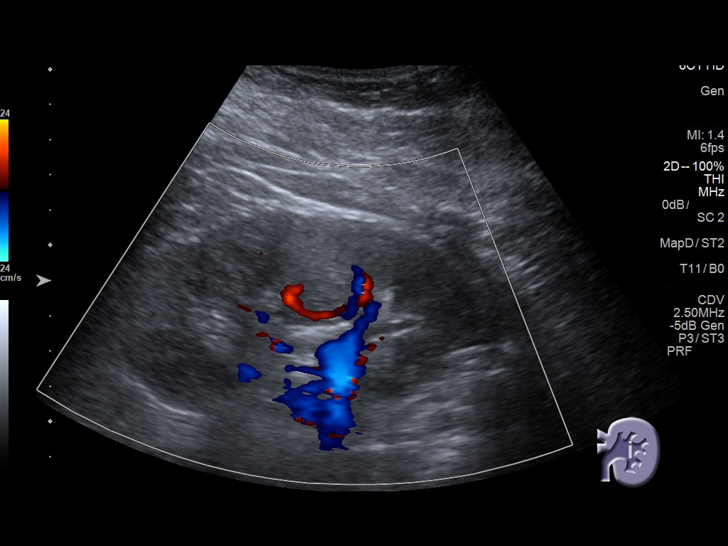
[im 89/107]
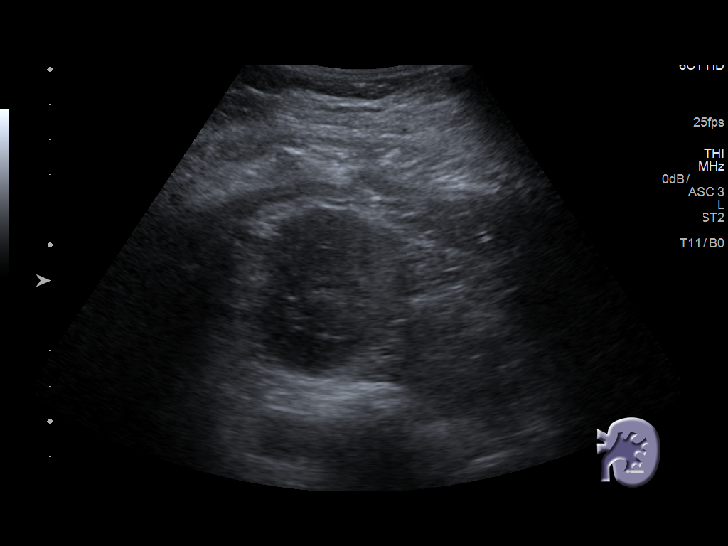
[im 98/107]
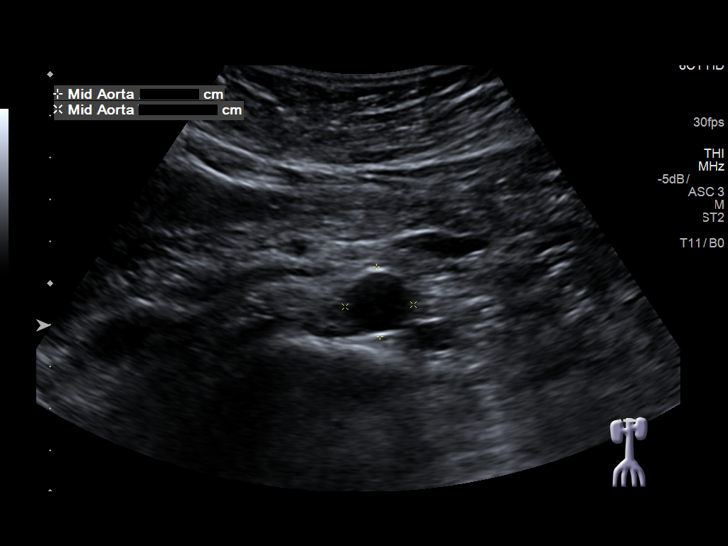
[im 107/107]
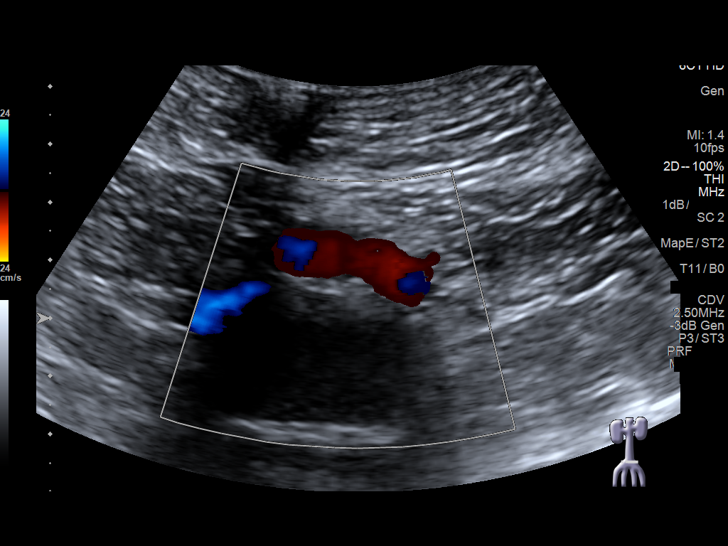

[14 of 25 positions shown; findings below may reference images not displayed]

FINDINGS: Gallbladder: No gallstones or wall thickening visualized. No
sonographic Murphy sign noted by sonographer.

Common bile duct: Diameter: 2.5 mm.

Liver: No focal lesion identified. Within normal limits in
parenchymal echogenicity. Portal vein is patent on color Doppler
imaging with normal direction of blood flow towards the liver.

IVC: No abnormality visualized.

Pancreas: Visualized portion unremarkable.

Spleen: Size and appearance within normal limits.

Right Kidney: Length: 10.3 cm. Echogenicity within normal limits. No
mass or hydronephrosis visualized.

Left Kidney: Length: 10.7 cm. Echogenicity within normal limits. No
mass or hydronephrosis visualized.

Abdominal aorta: No aneurysm visualized.

Other findings: None.
IMPRESSION: No definite abnormality seen in the abdomen.

## 2019-07-02 ENCOUNTER — Other Ambulatory Visit: Payer: Self-pay

## 2019-07-02 DIAGNOSIS — Z20822 Contact with and (suspected) exposure to covid-19: Secondary | ICD-10-CM

## 2019-07-03 LAB — NOVEL CORONAVIRUS, NAA: SARS-CoV-2, NAA: NOT DETECTED

## 2019-07-08 ENCOUNTER — Other Ambulatory Visit: Payer: Self-pay

## 2019-07-08 DIAGNOSIS — Z20822 Contact with and (suspected) exposure to covid-19: Secondary | ICD-10-CM

## 2019-07-10 LAB — NOVEL CORONAVIRUS, NAA: SARS-CoV-2, NAA: NOT DETECTED

## 2019-07-19 ENCOUNTER — Telehealth: Payer: Self-pay | Admitting: Adult Health

## 2019-07-19 DIAGNOSIS — S63259A Unspecified dislocation of unspecified finger, initial encounter: Secondary | ICD-10-CM

## 2019-07-19 NOTE — Telephone Encounter (Signed)
Patient's parent called to request referral to [facility] practice in  [place] ([place]) to see patient for appt--   Pls call parent if any [phone].  --glh

## 2019-07-19 NOTE — Telephone Encounter (Signed)
Attempted to call parent back, but VM box was full and unable to leave a message.  Charyl Bigger, CMA

## 2019-07-20 NOTE — Telephone Encounter (Signed)
Pt's mother states that he has already been seen by ortho for a dislocated finger, but needs a referral placed for [facility].  Referral placed.  Charyl Bigger, CMA

## 2019-11-20 ENCOUNTER — Ambulatory Visit: Payer: No Typology Code available for payment source

## 2020-03-14 ENCOUNTER — Other Ambulatory Visit: Payer: Self-pay

## 2020-03-14 ENCOUNTER — Telehealth: Payer: Self-pay

## 2020-03-14 ENCOUNTER — Emergency Department: Payer: No Typology Code available for payment source

## 2020-03-14 ENCOUNTER — Observation Stay
Admission: EM | Admit: 2020-03-14 | Discharge: 2020-03-15 | Disposition: A | Discharge status: Home / Self Care | Payer: No Typology Code available for payment source | Attending: Surgery | Admitting: Surgery

## 2020-03-14 ENCOUNTER — Telehealth: Payer: Self-pay | Admitting: Physician Assistant

## 2020-03-14 DIAGNOSIS — Z20822 Contact with and (suspected) exposure to covid-19: Secondary | ICD-10-CM | POA: Diagnosis not present

## 2020-03-14 DIAGNOSIS — R1010 Upper abdominal pain, unspecified: Secondary | ICD-10-CM

## 2020-03-14 DIAGNOSIS — K3580 Unspecified acute appendicitis: Principal | ICD-10-CM

## 2020-03-14 DIAGNOSIS — K37 Unspecified appendicitis: Secondary | ICD-10-CM | POA: Diagnosis present

## 2020-03-14 DIAGNOSIS — R1011 Right upper quadrant pain: Secondary | ICD-10-CM

## 2020-03-14 DIAGNOSIS — F17290 Nicotine dependence, other tobacco product, uncomplicated: Secondary | ICD-10-CM | POA: Insufficient documentation

## 2020-03-14 DIAGNOSIS — R1031 Right lower quadrant pain: Secondary | ICD-10-CM | POA: Diagnosis present

## 2020-03-14 LAB — CBC
HCT: 40.6 % (ref 39.0–52.0)
HCT: 40 % (ref 39.0–52.0)
Hemoglobin: 14.4 g/dL (ref 13.0–17.0)
Hemoglobin: 13.8 g/dL (ref 13.0–17.0)
MCH: 31.5 pg (ref 26.0–34.0)
MCH: 31.4 pg (ref 26.0–34.0)
MCHC: 35.5 g/dL (ref 30.0–36.0)
MCHC: 34.5 g/dL (ref 30.0–36.0)
MCV: 88.8 fL (ref 80.0–100.0)
MCV: 90.9 fL (ref 80.0–100.0)
Platelets: 189 10*3/uL (ref 150–400)
Platelets: 178 10*3/uL (ref 150–400)
RBC: 4.57 MIL/uL (ref 4.22–5.81)
RBC: 4.4 MIL/uL (ref 4.22–5.81)
RDW: 12.9 % (ref 11.5–15.5)
RDW: 13 % (ref 11.5–15.5)
WBC: 12.4 10*3/uL — ABNORMAL HIGH (ref 4.0–10.5)
WBC: 11.1 10*3/uL — ABNORMAL HIGH (ref 4.0–10.5)
nRBC: 0 % (ref 0.0–0.2)
nRBC: 0 % (ref 0.0–0.2)

## 2020-03-14 LAB — COMPREHENSIVE METABOLIC PANEL
ALT: 17 U/L (ref 0–44)
AST: 19 U/L (ref 15–41)
Albumin: 5.3 g/dL — ABNORMAL HIGH (ref 3.5–5.0)
Alkaline Phosphatase: 53 U/L (ref 38–126)
Anion gap: 13 (ref 5–15)
BUN: 14 mg/dL (ref 6–20)
CO2: 25 mmol/L (ref 22–32)
Calcium: 9.6 mg/dL (ref 8.9–10.3)
Chloride: 99 mmol/L (ref 98–111)
Creatinine, Ser: 0.96 mg/dL (ref 0.61–1.24)
GFR calc Af Amer: >60 mL/min (ref >60)
GFR calc non Af Amer: >60 mL/min (ref >60)
Glucose, Bld: 118 mg/dL — ABNORMAL HIGH (ref 70–99)
Potassium: 3.8 mmol/L (ref 3.5–5.1)
Sodium: 137 mmol/L (ref 135–145)
Total Bilirubin: 1.1 mg/dL (ref 0.3–1.2)
Total Protein: 8.4 g/dL — ABNORMAL HIGH (ref 6.5–8.1)

## 2020-03-14 LAB — URINALYSIS, COMPLETE (UACMP) WITH MICROSCOPIC
Bacteria, UA: NONE SEEN
Bilirubin Urine: NEGATIVE
Glucose, UA: NEGATIVE mg/dL
Hgb urine dipstick: NEGATIVE
Ketones, ur: 80 mg/dL — AB
Leukocytes,Ua: NEGATIVE
Nitrite: NEGATIVE
Protein, ur: 30 mg/dL — AB
Specific Gravity, Urine: 1.029 (ref 1.005–1.030)
pH: 6 (ref 5.0–8.0)

## 2020-03-14 LAB — SARS CORONAVIRUS 2 BY RT PCR (HOSPITAL ORDER, PERFORMED IN ~~LOC~~ HOSPITAL LAB): SARS Coronavirus 2: NEGATIVE

## 2020-03-14 LAB — MRSA PCR SCREENING: MRSA by PCR: NEGATIVE

## 2020-03-14 LAB — LIPASE, BLOOD: Lipase: 20 U/L (ref 11–51)

## 2020-03-14 MED ORDER — IOHEXOL 300 MG/ML  SOLN
100.0000 mL | Freq: Once | INTRAMUSCULAR | Status: AC | PRN
  Administered 2020-03-14: 100 mL via INTRAVENOUS
  Filled 2020-03-14: qty 100

## 2020-03-14 MED ORDER — ONDANSETRON 4 MG PO TBDP: 4.0000 mg | ORAL_TABLET | Freq: Four times a day (QID) | ORAL | Status: DC | PRN

## 2020-03-14 MED ORDER — PANTOPRAZOLE SODIUM 40 MG IV SOLR
40.0000 mg | Freq: Every day | INTRAVENOUS | Status: DC
  Administered 2020-03-14: 40 mg via INTRAVENOUS
  Filled 2020-03-14: qty 40

## 2020-03-14 MED ORDER — SODIUM CHLORIDE 0.9 % IV BOLUS
500.0000 mL | Freq: Once | INTRAVENOUS | Status: AC
  Administered 2020-03-14: 500 mL via INTRAVENOUS

## 2020-03-14 MED ORDER — OXYCODONE HCL 5 MG PO TABS: 5.0000 mg | ORAL_TABLET | ORAL | Status: DC | PRN

## 2020-03-14 MED ORDER — ONDANSETRON HCL 4 MG/2ML IJ SOLN
4.0000 mg | Freq: Once | INTRAMUSCULAR | Status: AC
  Administered 2020-03-14: 4 mg via INTRAVENOUS
  Filled 2020-03-14: qty 2

## 2020-03-14 MED ORDER — ENOXAPARIN SODIUM 40 MG/0.4ML ~~LOC~~ SOLN
40.0000 mg | SUBCUTANEOUS | Status: DC
  Filled 2020-03-14: qty 0.4

## 2020-03-14 MED ORDER — CIPROFLOXACIN IN D5W 400 MG/200ML IV SOLN: 400.0000 mg | Freq: Two times a day (BID) | INTRAVENOUS | Status: DC

## 2020-03-14 MED ORDER — KETOROLAC TROMETHAMINE 30 MG/ML IJ SOLN
30.0000 mg | Freq: Four times a day (QID) | INTRAMUSCULAR | Status: DC | PRN
  Administered 2020-03-15: 30 mg via INTRAVENOUS

## 2020-03-14 MED ORDER — MORPHINE SULFATE (PF) 2 MG/ML IV SOLN
2.0000 mg | INTRAVENOUS | Status: DC | PRN
  Filled 2020-03-14: qty 1

## 2020-03-14 MED ORDER — MORPHINE SULFATE (PF) 4 MG/ML IV SOLN
4.0000 mg | Freq: Once | INTRAVENOUS | Status: AC
  Administered 2020-03-14: 4 mg via INTRAVENOUS
  Filled 2020-03-14: qty 1

## 2020-03-14 MED ORDER — SODIUM CHLORIDE 0.9% FLUSH: 3.0000 mL | Freq: Once | INTRAVENOUS | Status: DC

## 2020-03-14 MED ORDER — PROCHLORPERAZINE EDISYLATE 10 MG/2ML IJ SOLN: 5.0000 mg | Freq: Four times a day (QID) | INTRAMUSCULAR | Status: DC | PRN

## 2020-03-14 MED ORDER — ONDANSETRON HCL 4 MG/2ML IJ SOLN
4.0000 mg | Freq: Four times a day (QID) | INTRAMUSCULAR | Status: DC | PRN
  Administered 2020-03-14 – 2020-03-15 (×2): 4 mg via INTRAVENOUS
  Filled 2020-03-14: qty 2

## 2020-03-14 MED ORDER — METRONIDAZOLE IN NACL 5-0.79 MG/ML-% IV SOLN
500.0000 mg | Freq: Three times a day (TID) | INTRAVENOUS | Status: DC
  Administered 2020-03-15 (×3): 500 mg via INTRAVENOUS
  Filled 2020-03-14 (×5): qty 100

## 2020-03-14 MED ORDER — ACETAMINOPHEN 500 MG PO TABS
1000.0000 mg | ORAL_TABLET | Freq: Four times a day (QID) | ORAL | Status: DC
  Administered 2020-03-14 – 2020-03-15 (×2): 1000 mg via ORAL
  Filled 2020-03-14: qty 2

## 2020-03-14 MED ORDER — SODIUM CHLORIDE 0.9 % IV SOLN: INTRAVENOUS | Status: DC

## 2020-03-14 MED ORDER — CIPROFLOXACIN IN D5W 400 MG/200ML IV SOLN
400.0000 mg | Freq: Two times a day (BID) | INTRAVENOUS | Status: DC
  Administered 2020-03-15: 400 mg via INTRAVENOUS
  Filled 2020-03-14 (×3): qty 200

## 2020-03-14 MED ORDER — PROCHLORPERAZINE MALEATE 10 MG PO TABS
10.0000 mg | ORAL_TABLET | Freq: Four times a day (QID) | ORAL | Status: DC | PRN
  Filled 2020-03-14: qty 1

## 2020-03-14 MED ORDER — METRONIDAZOLE IN NACL 5-0.79 MG/ML-% IV SOLN: 500.0000 mg | Freq: Three times a day (TID) | INTRAVENOUS | Status: DC

## 2020-03-14 NOTE — Telephone Encounter (Signed)
Spoke with pt's mother who states that the patient has been having significant weight loss and mid upper abdominal pain.  She has spoken with a Development worker, international aid, Dr. Kelby Fam, that she works with who is willing to see the patient tomorrow morning and is requesting referral be placed for insurance purposes.  Referral placed.  Tiajuana Amass, CMA

## 2020-03-14 NOTE — Telephone Encounter (Signed)
Patient's mother called requesting a call back from clinic staff to discuss her son's abnormal weight loss. She had requested an acute visit but we have nothing in the near future (patient is on [facility] plan and has to see [facility] specifically, unable to sch with [facility]). Since she was unable to schedule she is wondering about an imaging order or gastro referral (or whatever the clinic staff thinks would help). Please advise

## 2020-03-14 NOTE — H&P (Signed)
Patient ID: Dillon Carrillo, male   DOB: 1996-03-20, 24 y.o.   MRN: [id number]  HPI Dillon Carrillo is a 24 y.o. male with domino pain for the last 3 6 hours.  The pain started yesterday morning.  He reports initially started in the periumbilical area and now has located to the right lower quadrant.  The pain is sharp moderate intensity worsening with certain movements.  He also has associated nausea, vomiting and diarrhea.  No fevers no chills.  He is otherwise healthy.  He is able to perform more than 6 METS of activity without any shortness of breath or chest pain.  He works at [employer].  Mother is one of our OR nurses.  CT scan personally reviewed and shared with the patient in the family.  There is evidence of acute appendicitis without perforation no abscess.  No free air.  No other acute intra-abdominal abnormalities.  CMP is completely normal CBC shows a mild increase in the white count.  No previous abdominal operations.  HPI  Past Medical History:  Diagnosis Date  . Clavicle fracture    left  . Concussion     Past Surgical History:  Procedure Laterality Date  . CIRCUMCISION      Family History  Problem Relation Age of Onset  . Hypertension Mother   . Healthy Father   . Healthy Sister   . Healthy Brother   . Healthy Brother     Social History Social History   Tobacco Use  . Smoking status: Current Some Day Smoker    Types: E-cigarettes  . Smokeless tobacco: Never Used  Vaping Use  . Vaping Use: Never used  Substance Use Topics  . Alcohol use: Yes    Comment: every couple of months  . Drug use: Yes    Types: Marijuana    Allergies  Allergen Reactions  . Penicillins Rash, Swelling and Other (See Comments)    fever     Current Facility-Administered Medications  Medication Dose Route Frequency Provider Last Rate Last Admin  . ciprofloxacin (CIPRO) IVPB 400 mg  400 mg Intravenous Q12H Lanard Arguijo F, MD      . metroNIDAZOLE (FLAGYL)  IVPB 500 mg  500 mg Intravenous Q8H Zaiyah Sottile F, MD      . sodium chloride 0.9 % bolus 500 mL  500 mL Intravenous Once Maelynn Moroney F, MD      . sodium chloride flush (NS) 0.9 % injection 3 mL  3 mL Intravenous Once Jene Every, MD       No current outpatient medications on file.     Review of Systems Full ROS  was asked and was negative except for the information on the HPI  Physical Exam Blood pressure 128/70, pulse (!) 102, temperature 97.6 F (36.4 C), temperature source Oral, resp. rate 18, height 5\' 10"  (1.778 m), weight 76.2 kg, SpO2 100 %. CONSTITUTIONAL:NAD EYES: Pupils are equal, round, and reactive to light, Sclera are non-icteric. EARS, NOSE, MOUTH AND THROAT: The oropharynx is clear. The oral mucosa is pink and moist. Hearing is intact to voice. LYMPH NODES:  Lymph nodes in the neck are normal. RESPIRATORY:  Lungs are clear. There is normal respiratory effort, with equal breath sounds bilaterally, and without pathologic use of accessory muscles. CARDIOVASCULAR: Heart is regular without murmurs, gallops, or rubs. GI: The abdomen is  soft, , and non distended, mild tenderness palpation right lower quadrant.  No peritonitis no rebound.. There are no palpable masses. There is  no hepatosplenomegaly. There are normal bowel sounds in all quadrants. GU: Rectal deferred.   MUSCULOSKELETAL: Normal muscle strength and tone. No cyanosis or edema.   SKIN: Turgor is good and there are no pathologic skin lesions or ulcers. NEUROLOGIC: Motor and sensation is grossly normal. Cranial nerves are grossly intact. PSYCH:  Oriented to person, place and time. Affect is normal.  Data Reviewed  I have personally reviewed the patient's imaging, laboratory findings and medical records.    Assessment/Plan 24 year old male with chills consistent with acute appendicitis.  Currently no evidence of complication.  No evidence of abscess or perforation.  He is nontoxic and not peritoneal leak.   Discussed with the patient detail about his disease process.  Given his age and CT findings I do recommend appendectomy.  Procedure discussed with the patient in detail.  Risks, benefits and possible implications including but not limited to: Bleeding, infection, bowel injuries, rate interventions.  He understands and wished to proceed.  I have talked to your there is still 2 more cases going on tonight and we can do it in the morning.  I discussed this with the patient and I do think is reasonable to do in the morning.  In the meantime we will start IV antibiotics, fluid boluses and make sure he is n.p.o. after midnight.  All questions were answered.  Dillon Big, MD FACS General Surgeon 03/14/2020, 5:25 PM

## 2020-03-14 NOTE — ED Triage Notes (Signed)
Pt comes via POV from home with c/o mid lower abdominal pain. Pt states N/V/D.  Pt states it is just really uncomfortable

## 2020-03-14 NOTE — Telephone Encounter (Signed)
Spoke with patient regarding the lab work CBC,CMP-patient stated he was on his way to the emergency room, RUQ pain nausea, and vomiting.

## 2020-03-14 NOTE — ED Notes (Signed)
Pt ambulatory from triage with steady gait, NAD. Pt reports constant sharp pain in lower abdomen that started yesterday morning. Pt reports having diarrhea the night before last before the pain started. Pt c/o nausea and vomiting that started today. Skin warm and dry.

## 2020-03-14 NOTE — Addendum Note (Signed)
Addended by: Stan Head on: 03/14/2020 11:43 AM   Modules accepted: Orders

## 2020-03-14 NOTE — ED Provider Notes (Signed)
[facility] Emergency Department Provider Note   ____________________________________________    I have reviewed the triage vital signs and the nursing notes.   HISTORY  Chief Complaint Abdominal Pain     HPI Dillon Carrillo is a 24 y.o. male with history as noted below who presents with complaints of right lower quadrant pain.  Patient reports mild periumbilical pain yesterday now more right lower quadrant tenderness to palpation.  Worse with movement.  Positive nausea and vomiting.  Stools are essentially normal.  Does not take anything for this.  No history of abdominal surgery.  No sick contacts.  Past Medical History:  Diagnosis Date  . Clavicle fracture    left  . Concussion     Patient Active Problem List   Diagnosis Date Noted  . Acute bronchitis 01/19/2018  . Epigastric abdominal pain 12/02/2017  . RUQ pain 12/02/2017  . Screening for chlamydial disease 11/13/2017  . Healthcare maintenance 11/13/2017    Past Surgical History:  Procedure Laterality Date  . CIRCUMCISION      Prior to Admission medications   Not on File     Allergies Penicillins  Family History  Problem Relation Age of Onset  . Hypertension Mother   . Healthy Father   . Healthy Sister   . Healthy Brother   . Healthy Brother     Social History Social History   Tobacco Use  . Smoking status: Current Some Day Smoker    Types: E-cigarettes  . Smokeless tobacco: Never Used  Vaping Use  . Vaping Use: Never used  Substance Use Topics  . Alcohol use: Yes    Comment: every couple of months  . Drug use: Yes    Types: Marijuana    Review of Systems  Constitutional: No fever/chills Eyes: No visual changes.  ENT: No sore throat. Cardiovascular: Denies chest pain. Respiratory: Denies shortness of breath. Gastrointestinal: As above Genitourinary: Negative for dysuria. Musculoskeletal: Negative for back pain. Skin: Negative for  rash. Neurological: Negative for headaches  ____________________________________________   PHYSICAL EXAM:  VITAL SIGNS: ED Triage Vitals  Enc Vitals Group     BP 03/14/20 1350 128/70     Pulse Rate 03/14/20 1350 (!) 102     Resp 03/14/20 1350 18     Temp 03/14/20 1350 97.6 F (36.4 C)     Temp Source 03/14/20 1350 Oral     SpO2 03/14/20 1350 100 %     Weight 03/14/20 1350 76.2 kg (168 lb)     Height 03/14/20 1350 1.778 m (5\' 10" )     Head Circumference --      Peak Flow --      Pain Score 03/14/20 1400 7     Pain Loc --      Pain Edu? --      Excl. in GC? --     Constitutional: Alert and oriented  Nose: No congestion/rhinnorhea. Mouth/Throat: Mucous membranes are moist.   Neck:  Painless ROM Cardiovascular: Normal rate, regular rhythm. Grossly normal heart sounds.  Good peripheral circulation. Respiratory: Normal respiratory effort.  No retractions. Lungs CTAB. Gastrointestinal: Right lower quadrant tenderness palpation McBurney's point, abdomen is soft. No distention.  No CVA tenderness.  Musculoskeletal:.  Warm and well perfused Neurologic:  Normal speech and language. No gross focal neurologic deficits are appreciated.  Skin:  Skin is warm, dry and intact. No rash noted. Psychiatric: Mood and affect are normal. Speech and behavior are normal.  ____________________________________________  LABS (all labs ordered are listed, but only abnormal results are displayed)  Labs Reviewed  COMPREHENSIVE METABOLIC PANEL - Abnormal; Notable for the following components:      Result Value   Glucose, Bld 118 (*)    Total Protein 8.4 (*)    Albumin 5.3 (*)    All other components within normal limits  CBC - Abnormal; Notable for the following components:   WBC 12.4 (*)    All other components within normal limits  URINALYSIS, COMPLETE (UACMP) WITH MICROSCOPIC - Abnormal; Notable for the following components:   Color, Urine YELLOW (*)    APPearance HAZY (*)    Ketones,  ur 80 (*)    Protein, ur 30 (*)    All other components within normal limits  SARS CORONAVIRUS 2 BY RT PCR (HOSPITAL ORDER, PERFORMED IN [facility])  LIPASE, BLOOD   ____________________________________________  EKG  None ____________________________________________  RADIOLOGY  CT abdomen pelvis ____________________________________________   PROCEDURES  Procedure(s) performed: No  Procedures   Critical Care performed: No ____________________________________________   INITIAL IMPRESSION / ASSESSMENT AND PLAN / ED COURSE  Pertinent labs & imaging results that were available during my care of the patient were reviewed by me and considered in my medical decision making (see chart for details).  Patient presents with abdominal pain described above.  Differential includes appendicitis, viral gastroenteritis, colitis, less likely kidney stone  We will treat with IV morphine, IV Zofran, IV fluids  Lab work notable for elevated white blood cell count of 12.4.  Will need to obtain CT abdomen pelvis to evaluate for appendicitis  CT scan is consistent with appendicitis, unruptured, have discussed with Dr. Maia Plan of general surgery, he will admit   ____________________________________________   FINAL CLINICAL IMPRESSION(S) / ED DIAGNOSES  Final diagnoses:  Acute appendicitis, unspecified acute appendicitis type        Note:  This document was prepared using Dragon voice recognition software and may include unintentional dictation errors.   Jene Every, MD 03/14/20 [phone]

## 2020-03-15 ENCOUNTER — Observation Stay: Payer: No Typology Code available for payment source | Admitting: Anesthesiology

## 2020-03-15 ENCOUNTER — Encounter: Payer: Self-pay | Admitting: Surgery

## 2020-03-15 ENCOUNTER — Ambulatory Visit: Payer: Self-pay | Admitting: Surgery

## 2020-03-15 ENCOUNTER — Encounter
Admission: EM | Disposition: A | Discharge status: Home / Self Care | Payer: Self-pay | Source: Home / Self Care | Attending: Emergency Medicine

## 2020-03-15 DIAGNOSIS — K3580 Unspecified acute appendicitis: Secondary | ICD-10-CM | POA: Diagnosis not present

## 2020-03-15 HISTORY — PX: LAPAROSCOPIC APPENDECTOMY: SHX408

## 2020-03-15 LAB — URINE DRUG SCREEN, QUALITATIVE (ARMC ONLY)
Amphetamines, Ur Screen: NOT DETECTED
Barbiturates, Ur Screen: NOT DETECTED
Benzodiazepine, Ur Scrn: NOT DETECTED
Cannabinoid 50 Ng, Ur ~~LOC~~: POSITIVE — AB
Cocaine Metabolite,Ur ~~LOC~~: NOT DETECTED
MDMA (Ecstasy)Ur Screen: NOT DETECTED
Methadone Scn, Ur: NOT DETECTED
Opiate, Ur Screen: NOT DETECTED
Phencyclidine (PCP) Ur S: NOT DETECTED
Tricyclic, Ur Screen: NOT DETECTED

## 2020-03-15 LAB — HIV ANTIBODY (ROUTINE TESTING W REFLEX): HIV Screen 4th Generation wRfx: NONREACTIVE

## 2020-03-15 SURGERY — APPENDECTOMY, LAPAROSCOPIC
Anesthesia: General

## 2020-03-15 MED ORDER — ROCURONIUM BROMIDE 10 MG/ML (PF) SYRINGE
PREFILLED_SYRINGE | INTRAVENOUS | Status: AC
  Filled 2020-03-15: qty 10

## 2020-03-15 MED ORDER — FENTANYL CITRATE (PF) 100 MCG/2ML IJ SOLN: 25.0000 ug | INTRAMUSCULAR | Status: DC | PRN

## 2020-03-15 MED ORDER — OXYCODONE HCL 5 MG/5ML PO SOLN: 5.0000 mg | Freq: Once | ORAL | Status: DC | PRN

## 2020-03-15 MED ORDER — BUPIVACAINE LIPOSOME 1.3 % IJ SUSP
INTRAMUSCULAR | Status: AC
  Filled 2020-03-15: qty 20

## 2020-03-15 MED ORDER — FENTANYL CITRATE (PF) 250 MCG/5ML IJ SOLN
INTRAMUSCULAR | Status: AC
  Filled 2020-03-15: qty 5

## 2020-03-15 MED ORDER — OXYCODONE HCL 5 MG PO TABS: 5.0000 mg | ORAL_TABLET | Freq: Once | ORAL | Status: DC | PRN

## 2020-03-15 MED ORDER — ROCURONIUM BROMIDE 100 MG/10ML IV SOLN
INTRAVENOUS | Status: DC | PRN
  Administered 2020-03-15: 50 mg via INTRAVENOUS

## 2020-03-15 MED ORDER — LIDOCAINE HCL (PF) 2 % IJ SOLN
INTRAMUSCULAR | Status: AC
  Filled 2020-03-15: qty 5

## 2020-03-15 MED ORDER — LIDOCAINE HCL (CARDIAC) PF 100 MG/5ML IV SOSY
PREFILLED_SYRINGE | INTRAVENOUS | Status: DC | PRN
  Administered 2020-03-15: 100 mg via INTRAVENOUS

## 2020-03-15 MED ORDER — MIDAZOLAM HCL 2 MG/2ML IJ SOLN
INTRAMUSCULAR | Status: AC
  Filled 2020-03-15: qty 2

## 2020-03-15 MED ORDER — ONDANSETRON HCL 4 MG/2ML IJ SOLN
INTRAMUSCULAR | Status: AC
  Filled 2020-03-15: qty 2

## 2020-03-15 MED ORDER — KETOROLAC TROMETHAMINE 30 MG/ML IJ SOLN
INTRAMUSCULAR | Status: AC
  Filled 2020-03-15: qty 1

## 2020-03-15 MED ORDER — BUPIVACAINE-EPINEPHRINE 0.25% -1:200000 IJ SOLN
INTRAMUSCULAR | Status: DC | PRN
  Administered 2020-03-15: 30 mL

## 2020-03-15 MED ORDER — SUGAMMADEX SODIUM 500 MG/5ML IV SOLN
INTRAVENOUS | Status: DC | PRN
  Administered 2020-03-15: 200 mg via INTRAVENOUS

## 2020-03-15 MED ORDER — ACETAMINOPHEN 10 MG/ML IV SOLN: 1000.0000 mg | Freq: Once | INTRAVENOUS | Status: DC | PRN

## 2020-03-15 MED ORDER — PROPOFOL 10 MG/ML IV BOLUS
INTRAVENOUS | Status: AC
  Filled 2020-03-15: qty 20

## 2020-03-15 MED ORDER — HYDROCODONE-ACETAMINOPHEN 5-325 MG PO TABS
1.0000 | ORAL_TABLET | Freq: Four times a day (QID) | ORAL | 0 refills | Status: DC | PRN
Start: 2020-03-15 — End: 2020-06-08

## 2020-03-15 MED ORDER — ACETAMINOPHEN 10 MG/ML IV SOLN
INTRAVENOUS | Status: AC
  Filled 2020-03-15: qty 100

## 2020-03-15 MED ORDER — LACTATED RINGERS IV SOLN: INTRAVENOUS | Status: DC | PRN

## 2020-03-15 MED ORDER — DEXAMETHASONE SODIUM PHOSPHATE 10 MG/ML IJ SOLN
INTRAMUSCULAR | Status: AC
  Filled 2020-03-15: qty 1

## 2020-03-15 MED ORDER — BUPIVACAINE-EPINEPHRINE (PF) 0.25% -1:200000 IJ SOLN
INTRAMUSCULAR | Status: AC
  Filled 2020-03-15: qty 30

## 2020-03-15 MED ORDER — FENTANYL CITRATE (PF) 100 MCG/2ML IJ SOLN
INTRAMUSCULAR | Status: DC | PRN
  Administered 2020-03-15: 100 ug via INTRAVENOUS
  Administered 2020-03-15: 150 ug via INTRAVENOUS

## 2020-03-15 MED ORDER — BUPIVACAINE LIPOSOME 1.3 % IJ SUSP
INTRAMUSCULAR | Status: DC | PRN
  Administered 2020-03-15: 20 mL

## 2020-03-15 MED ORDER — ONDANSETRON HCL 4 MG/2ML IJ SOLN
4.0000 mg | Freq: Once | INTRAMUSCULAR | Status: AC | PRN
  Administered 2020-03-15: 4 mg via INTRAVENOUS

## 2020-03-15 MED ORDER — PROPOFOL 10 MG/ML IV BOLUS
INTRAVENOUS | Status: DC | PRN
  Administered 2020-03-15: 200 mg via INTRAVENOUS

## 2020-03-15 MED ORDER — MIDAZOLAM HCL 2 MG/2ML IJ SOLN
INTRAMUSCULAR | Status: DC | PRN
  Administered 2020-03-15: 2 mg via INTRAVENOUS

## 2020-03-15 MED ORDER — DEXAMETHASONE SODIUM PHOSPHATE 10 MG/ML IJ SOLN
INTRAMUSCULAR | Status: DC | PRN
  Administered 2020-03-15: 10 mg via INTRAVENOUS

## 2020-03-15 MED ORDER — SUCCINYLCHOLINE CHLORIDE 20 MG/ML IJ SOLN
INTRAMUSCULAR | Status: DC | PRN
  Administered 2020-03-15: 100 mg via INTRAVENOUS

## 2020-03-15 SURGICAL SUPPLY — 39 items
ADH SKN CLS APL DERMABOND .7 (GAUZE/BANDAGES/DRESSINGS) ×1
APL PRP STRL LF DISP 70% ISPRP (MISCELLANEOUS) ×1
APPLIER CLIP 5 13 M/L LIGAMAX5 (MISCELLANEOUS)
BLADE CLIPPER SURG (BLADE) ×3 IMPLANT
CANISTER SUCT 1200ML W/VALVE (MISCELLANEOUS) ×3 IMPLANT
CHLORAPREP W/TINT 26 (MISCELLANEOUS) ×3 IMPLANT
CLIP APPLIE 5 13 M/L LIGAMAX5 (MISCELLANEOUS) IMPLANT
COVER WAND RF STERILE (DRAPES) ×3 IMPLANT
CUTTER FLEX LINEAR 45M (STAPLE) ×3 IMPLANT
DERMABOND ADVANCED (GAUZE/BANDAGES/DRESSINGS) ×2
DERMABOND ADVANCED .7 DNX12 (GAUZE/BANDAGES/DRESSINGS) ×1 IMPLANT
ELECT CAUTERY BLADE 6.4 (BLADE) ×3 IMPLANT
ELECT REM PT RETURN 9FT ADLT (ELECTROSURGICAL) ×3
ELECTRODE REM PT RTRN 9FT ADLT (ELECTROSURGICAL) ×1 IMPLANT
GLOVE BIO SURGEON STRL SZ7 (GLOVE) ×3 IMPLANT
GOWN STRL REUS W/ TWL LRG LVL3 (GOWN DISPOSABLE) ×2 IMPLANT
GOWN STRL REUS W/TWL LRG LVL3 (GOWN DISPOSABLE) ×6
IRRIGATION STRYKERFLOW (MISCELLANEOUS) ×1 IMPLANT
IRRIGATOR STRYKERFLOW (MISCELLANEOUS) ×3
IV NS 1000ML (IV SOLUTION) ×3
IV NS 1000ML BAXH (IV SOLUTION) ×1 IMPLANT
NEEDLE HYPO 22GX1.5 SAFETY (NEEDLE) ×3 IMPLANT
NS IRRIG 500ML POUR BTL (IV SOLUTION) ×3 IMPLANT
PACK LAP CHOLECYSTECTOMY (MISCELLANEOUS) ×3 IMPLANT
PENCIL ELECTRO HAND CTR (MISCELLANEOUS) ×3 IMPLANT
POUCH SPECIMEN RETRIEVAL 10MM (ENDOMECHANICALS) ×3 IMPLANT
RELOAD 45 VASCULAR/THIN (ENDOMECHANICALS) IMPLANT
RELOAD STAPLE TA45 3.5 REG BLU (ENDOMECHANICALS) IMPLANT
SCISSORS METZENBAUM CVD 33 (INSTRUMENTS) IMPLANT
SHEARS HARMONIC ACE PLUS 36CM (ENDOMECHANICALS) ×3 IMPLANT
SLEEVE ENDOPATH XCEL 5M (ENDOMECHANICALS) ×3 IMPLANT
SPONGE LAP 18X18 RF (DISPOSABLE) ×3 IMPLANT
SUT MNCRL AB 4-0 PS2 18 (SUTURE) ×3 IMPLANT
SUT VICRYL 0 AB UR-6 (SUTURE) ×6 IMPLANT
SYR 20ML LL LF (SYRINGE) ×3 IMPLANT
TRAY FOLEY MTR SLVR 16FR STAT (SET/KITS/TRAYS/PACK) ×3 IMPLANT
TROCAR XCEL BLUNT TIP 100MML (ENDOMECHANICALS) ×3 IMPLANT
TROCAR XCEL NON-BLD 5MMX100MML (ENDOMECHANICALS) ×6 IMPLANT
TUBING EVAC SMOKE HEATED PNEUM (TUBING) ×3 IMPLANT

## 2020-03-15 NOTE — Anesthesia Postprocedure Evaluation (Signed)
Anesthesia Post Note  Patient: Dillon Carrillo  Procedure(s) Performed: APPENDECTOMY LAPAROSCOPIC (N/A )  Patient location during evaluation: PACU Anesthesia Type: General Level of consciousness: awake and alert Pain management: pain level controlled Vital Signs Assessment: post-procedure vital signs reviewed and stable Respiratory status: spontaneous breathing, nonlabored ventilation, respiratory function stable and patient connected to nasal cannula oxygen Cardiovascular status: blood pressure returned to baseline and stable Postop Assessment: no apparent nausea or vomiting Anesthetic complications: no   No complications documented.   Last Vitals:  Vitals:   03/15/20 1307 03/15/20 1318  BP: 112/80 (!) 143/92  Pulse: 67 64  Resp: 15 16  Temp:  (!) 36.4 C  SpO2: 100% 100%    Last Pain:  Vitals:   03/15/20 1318  TempSrc: Oral  PainSc:                  Corinda Gubler

## 2020-03-15 NOTE — Progress Notes (Signed)
Pt discharged per MD order. IV removed. Prescription given to pt. Discharge instructions reviewed with pt. Pt verbalized understanding with all questions answered to pt satisfaction. Pt taken to car in wheelchair by staff.

## 2020-03-15 NOTE — Anesthesia Procedure Notes (Signed)
Procedure Name: Intubation Performed by: Gaynelle Cage, CRNA Pre-anesthesia Checklist: Patient identified, Emergency Drugs available, Suction available and Patient being monitored Patient Re-evaluated:Patient Re-evaluated prior to induction Oxygen Delivery Method: Circle system utilized Preoxygenation: Pre-oxygenation with 100% oxygen Induction Type: IV induction, Cricoid Pressure applied and Rapid sequence Laryngoscope Size: Mac and 3 Grade View: Grade I Tube type: Oral Tube size: 7.5 mm Number of attempts: 1 Placement Confirmation: ETT inserted through vocal cords under direct vision,  positive ETCO2 and breath sounds checked- equal and bilateral Secured at: 21 cm Tube secured with: Tape Dental Injury: Teeth and Oropharynx as per pre-operative assessment

## 2020-03-15 NOTE — Progress Notes (Signed)
Preoperative Review   Patient is met in the preoperative holding area. The history is reviewed in the chart and with the patient. I personally reviewed the options and rationale as well as the risks of this procedure that have been previously discussed with the patient. All questions asked by the patient and/or family were answered to their satisfaction.  Patient agrees to proceed with this procedure at this time.  Caroleen Hamman M.D. FACS

## 2020-03-15 NOTE — Op Note (Signed)
laparascopic appendectomy   SKILER OLDEN Date of operation:  03/15/2020  Indications: The patient presented with a history of  abdominal pain. Workup has revealed findings consistent with acute appendicitis.  Pre-operative Diagnosis: Acute appendicitis without mention of peritonitis  Post-operative Diagnosis: Same  Surgeon: Sterling Big, MD, FACS  Anesthesia: General with endotracheal tube  Findings: Acute appenditis  Estimated Blood Loss: 5cc         Specimens: appendix         Complications:  none  Procedure Details  The patient was seen again in the preop area. The options of surgery versus observation were reviewed with the patient and/or family. The risks of bleeding, infection, recurrence of symptoms, negative laparoscopy, potential for an open procedure, bowel injury, abscess or infection, were all reviewed as well. The patient was taken to Operating Room, identified as ROREY BISSON and the procedure verified as laparoscopic appendectomy. A Time Out was held and the above information confirmed.  The patient was placed in the supine position and general anesthesia was induced.  Antibiotic prophylaxis was administered and VT E prophylaxis was in place.   The abdomen was prepped and draped in a sterile fashion. An infraumbilical incision was made. A cutdown technique was used to enter the abdominal cavity. Two vicryl stitches were placed on the fascia and a Hasson trocar inserted. Pneumoperitoneum obtained. Two 5 mm ports were placed under direct visualization.   The appendix was identified and found to be acutely inflamed but not perforated The appendix was carefully dissected. The mesoappendix was divided withHarmonic scalpel. The base of the appendix was dissected out and divided with a standard load Endo GIA.The appendix was placed in a Endo Catch bag and removed via the Hasson port. The right lower quadrant and pelvis was then irrigated with   normal saline which was aspirated. Inspection  failed to identify any additional bleeding and there were no signs of bowel injury. Again the right lower quadrant was inspected there was no sign of bleeding or bowel injury therefore pneumoperitoneum was released, all ports were removed.  The umbilical fascia was closed with 0 Vicryl interrupted sutures and the skin incisions were approximated with subcuticular 4-0 Monocryl. Dermabond was placed The patient tolerated the procedure well, there were no complications. The sponge lap and needle count were correct at the end of the procedure Liposomal marcaine was injected in all incisions for post op analgesia.  The patient was taken to the recovery room in stable condition to be admitted for continued care.    Sterling Big, MD FACS

## 2020-03-15 NOTE — Transfer of Care (Signed)
Immediate Anesthesia Transfer of Care Note  Patient: Dillon Carrillo  Procedure(s) Performed: APPENDECTOMY LAPAROSCOPIC (N/A )  Patient Location: PACU  Anesthesia Type:General  Level of Consciousness: sedated, drowsy and patient cooperative  Airway & Oxygen Therapy: Patient Spontanous Breathing and Patient connected to face mask oxygen  Post-op Assessment: Report given to RN and Post -op Vital signs reviewed and stable  Post vital signs: Reviewed and stable  Last Vitals:  Vitals Value Taken Time  BP 119/60 03/15/20 1221  Temp 36.5 C 03/15/20 1220  Pulse 85 03/15/20 1222  Resp 15 03/15/20 1222  SpO2 100 % 03/15/20 1222  Vitals shown include unvalidated device data.  Last Pain:  Vitals:   03/15/20 1220  TempSrc:   PainSc: Asleep         Complications: No complications documented.

## 2020-03-15 NOTE — Discharge Summary (Signed)
[facility] SURGICAL DISCHARGE SUMMARY   Patient ID: Dillon Carrillo MRN: [id number] DOB/AGE: [birth year] 24 y.o.  Admit date: 03/14/2020 Discharge date: 03/15/2020  Discharge Diagnoses Patient Active Problem List   Diagnosis Date Noted  . Appendicitis 03/14/2020   Consultants None  Procedures 03/15/2020 Laparoscopic Appendectomy  HPI: Dillon Carrillo is a 24 y.o. male with domino pain for the last 3 6 hours.  The pain started yesterday morning.  He reports initially started in the periumbilical area and now has located to the right lower quadrant.  The pain is sharp moderate intensity worsening with certain movements.  He also has associated nausea, vomiting and diarrhea.  No fevers no chills.  He is otherwise healthy.  He is able to perform more than 6 METS of activity without any shortness of breath or chest pain.  He works at [employer].  Mother is one of our OR nurses.  CT scan personally reviewed and shared with the patient in the family.  There is evidence of acute appendicitis without perforation no abscess.  No free air.  No other acute intra-abdominal abnormalities.  CMP is completely normal CBC shows a mild increase in the white count.  No previous abdominal operations.  Hospital Course: Informed consent was obtained and documented, and patient underwent uneventful laparoscopic appendectomy (Dr Everlene Farrier, 03/15/2020).  Post-operatively, patient's pain/symptoms improved/resolved and advancement of patient's diet and ambulation were well-tolerated. The remainder of patient's hospital course was essentially unremarkable, and discharge planning was initiated accordingly with patient safely able to be discharged home with appropriate discharge instructions, pain control, and outpatient follow-up after all of his questions were answered to his expressed satisfaction.   Discharge Condition: Good   Allergies as of 03/15/2020      Reactions    Penicillins Rash, Swelling, Other (See Comments)   fever      Medication List    TAKE these medications   HYDROcodone-acetaminophen 5-325 MG tablet Commonly known as: NORCO/VICODIN Take 1-2 tablets by mouth every 6 (six) hours as needed for moderate pain.         Follow-up Information    Leafy Ro, MD. Go on 03/22/2020.   Specialty: General Surgery Why: Virtual 3:45pm appointment Contact information: [address] [place] [address] [phone]                Time spent on discharge management including discussion of hospital course, clinical condition, outpatient instructions, prescriptions, and follow up with the patient and members of the medical team: >30 minutes  -- Lynden Oxford , PA-C [facility]  03/15/2020, 3:06 PM [phone] M-F: 7am - 4pm

## 2020-03-15 NOTE — Anesthesia Preprocedure Evaluation (Addendum)
Anesthesia Evaluation  Patient identified by MRN, date of birth, ID band Patient awake    Reviewed: Allergy & Precautions, NPO status , Patient's Chart, lab work & pertinent test results  History of Anesthesia Complications Negative for: history of anesthetic complications  Airway Mallampati: I  TM Distance: >3 FB Neck ROM: Full    Dental no notable dental hx. (+) Teeth Intact, Dental Advisory Given   Pulmonary neg sleep apnea, neg COPD, Current Smoker and Patient abstained from smoking.,    Pulmonary exam normal breath sounds clear to auscultation       Cardiovascular Exercise Tolerance: Good METS(-) hypertension(-) CAD and (-) Past MI negative cardio ROS  (-) dysrhythmias  Rhythm:Regular Rate:Normal - Systolic murmurs    Neuro/Psych negative neurological ROS  negative psych ROS   GI/Hepatic neg GERD  ,(+)     substance abuse  marijuana use,   Endo/Other  neg diabetes  Renal/GU negative Renal ROS     Musculoskeletal   Abdominal   Peds  Hematology   Anesthesia Other Findings Past Medical History: No date: Clavicle fracture     Comment:  left No date: Concussion   Reproductive/Obstetrics                           Anesthesia Physical Anesthesia Plan  ASA: II  Anesthesia Plan: General   Post-op Pain Management:    Induction: Intravenous, Rapid sequence and Cricoid pressure planned  PONV Risk Score and Plan: 2 and Ondansetron, Dexamethasone and Midazolam  Airway Management Planned: Oral ETT  Additional Equipment: None  Intra-op Plan:   Post-operative Plan: Extubation in OR  Informed Consent: I have reviewed the patients History and Physical, chart, labs and discussed the procedure including the risks, benefits and alternatives for the proposed anesthesia with the patient or authorized representative who has indicated his/her understanding and acceptance.     Dental  advisory given  Plan Discussed with: CRNA and Surgeon  Anesthesia Plan Comments: (Discussed risks of anesthesia with patient, including PONV, sore throat, lip/dental damage. Rare risks discussed as well, such as cardiorespiratory and neurological sequelae. Patient understands.  )        Anesthesia Quick Evaluation

## 2020-03-16 ENCOUNTER — Encounter: Payer: Self-pay | Admitting: Surgery

## 2020-03-16 LAB — SURGICAL PATHOLOGY

## 2020-03-17 ENCOUNTER — Telehealth: Payer: Self-pay

## 2020-03-17 NOTE — Telephone Encounter (Signed)
PC to pt.  Pt. was aware of [place] Carrillo contamination.  Reassured pt. That it was in one local area, and that no water contamination detected at [facility]. Reassured that there is a remote chance that he could have been infected.  Advised to report diarrhea, stomach cramping/pain, or nausea/ vomiting to his PCP.  Encouraged to boil water until further notice.  Pt. Verb. Understanding.  Denied any questions.

## 2020-03-22 ENCOUNTER — Telehealth (INDEPENDENT_AMBULATORY_CARE_PROVIDER_SITE_OTHER): Payer: No Typology Code available for payment source | Admitting: Surgery

## 2020-03-22 ENCOUNTER — Other Ambulatory Visit: Payer: Self-pay

## 2020-03-22 DIAGNOSIS — Z09 Encounter for follow-up examination after completed treatment for conditions other than malignant neoplasm: Secondary | ICD-10-CM

## 2020-03-22 NOTE — Progress Notes (Signed)
Call with the patient today.  He is doing well following an appendectomy that was uneventful.  No fevers no chills he had some loose bowel movement but now they are back to normal.  He is eating.  No fevers or chills.  Thoroughly discussed with him in detail.  He returned back to work on [employer] duty.  A/P Doing well Gaining weight restrictions for a total of 6 weeks.  RTC as needed

## 2020-03-23 ENCOUNTER — Telehealth: Payer: Self-pay | Admitting: *Deleted

## 2020-03-23 NOTE — Telephone Encounter (Signed)
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

## 2020-05-22 ENCOUNTER — Telehealth: Payer: Self-pay | Admitting: Physician Assistant

## 2020-05-22 NOTE — Telephone Encounter (Signed)
Pt's mother called and said her son was diagnosed with COVID last week (9/14) and now has developed sores in his mouth. She didn't know if there was anything that we can actually do and wanted me to send a message to clinic staff prior to sch an appt. Please advise

## 2020-05-22 NOTE — Telephone Encounter (Signed)
Please advise.  T. Nahal Wanless, CMA 

## 2020-05-23 NOTE — Telephone Encounter (Signed)
Mouth sores could possibly be related to COVID infection. I would recommend trying OTC mouthwash for oral sores. If symptoms fail to improve or worsen then recommend to schedule an appointment for further evaluation and we could try other treatment options such as magic mouthwash.  Thank you, Kandis Cocking

## 2020-05-24 NOTE — Telephone Encounter (Signed)
Pt's mother informed.  Mother inquired about how long pt should wait after COVID to receive vaccine.  Advised that pt needs to wait 30 days before receiving vaccine.  Mother expressed understanding and is agreeable.  Tiajuana Amass, CMA

## 2020-06-08 ENCOUNTER — Other Ambulatory Visit: Payer: Self-pay

## 2020-06-08 ENCOUNTER — Ambulatory Visit (INDEPENDENT_AMBULATORY_CARE_PROVIDER_SITE_OTHER): Payer: No Typology Code available for payment source | Admitting: Physician Assistant

## 2020-06-08 ENCOUNTER — Encounter: Payer: Self-pay | Admitting: Physician Assistant

## 2020-06-08 VITALS — BP 92/60 | HR 68 | Ht 70.0 in | Wt 159.5 lb

## 2020-06-08 DIAGNOSIS — Z23 Encounter for immunization: Secondary | ICD-10-CM | POA: Diagnosis not present

## 2020-06-08 DIAGNOSIS — Z0000 Encounter for general adult medical examination without abnormal findings: Secondary | ICD-10-CM

## 2020-06-08 NOTE — Patient Instructions (Signed)
Preventive Care 19-22 Years Old, Male Preventive care refers to lifestyle choices and visits with your health care provider that can promote health and wellness. This includes:  A yearly physical exam. This is also called an annual well check.  Regular dental and eye exams.  Immunizations.  Screening for certain conditions.  Healthy lifestyle choices, such as eating a healthy diet, getting regular exercise, not using drugs or products that contain nicotine and tobacco, and limiting alcohol use. What can I expect for my preventive care visit? Physical exam Your health care provider will check:  Height and weight. These may be used to calculate body mass index (BMI), which is a measurement that tells if you are at a healthy weight.  Heart rate and blood pressure.  Your skin for abnormal spots. Counseling Your health care provider may ask you questions about:  Alcohol, tobacco, and drug use.  Emotional well-being.  Home and relationship well-being.  Sexual activity.  Eating habits.  Work and work Statistician. What immunizations do I need?  Influenza (flu) vaccine  This is recommended every year. Tetanus, diphtheria, and pertussis (Tdap) vaccine  You may need a Td booster every 10 years. Varicella (chickenpox) vaccine  You may need this vaccine if you have not already been vaccinated. Human papillomavirus (HPV) vaccine  If recommended by your health care provider, you may need three doses over 6 months. Measles, mumps, and rubella (MMR) vaccine  You may need at least one dose of MMR. You may also need a second dose. Meningococcal conjugate (MenACWY) vaccine  One dose is recommended if you are 45-76 years old and a Market researcher living in a residence hall, or if you have one of several medical conditions. You may also need additional booster doses. Pneumococcal conjugate (PCV13) vaccine  You may need this if you have certain conditions and were not  previously vaccinated. Pneumococcal polysaccharide (PPSV23) vaccine  You may need one or two doses if you smoke cigarettes or if you have certain conditions. Hepatitis A vaccine  You may need this if you have certain conditions or if you travel or work in places where you may be exposed to hepatitis A. Hepatitis B vaccine  You may need this if you have certain conditions or if you travel or work in places where you may be exposed to hepatitis B. Haemophilus influenzae type b (Hib) vaccine  You may need this if you have certain risk factors. You may receive vaccines as individual doses or as more than one vaccine together in one shot (combination vaccines). Talk with your health care provider about the risks and benefits of combination vaccines. What tests do I need? Blood tests  Lipid and cholesterol levels. These may be checked every 5 years starting at age 17.  Hepatitis C test.  Hepatitis B test. Screening   Diabetes screening. This is done by checking your blood sugar (glucose) after you have not eaten for a while (fasting).  Sexually transmitted disease (STD) testing. Talk with your health care provider about your test results, treatment options, and if necessary, the need for more tests. Follow these instructions at home: Eating and drinking   Eat a diet that includes fresh fruits and vegetables, whole grains, lean protein, and low-fat dairy products.  Take vitamin and mineral supplements as recommended by your health care provider.  Do not drink alcohol if your health care provider tells you not to drink.  If you drink alcohol: ? Limit how much you have to 0-2  drinks a day. ? Be aware of how much alcohol is in your drink. In the [place] one drink equals one 12 oz bottle of beer (355 mL), one 5 oz glass of wine (148 mL), or one 1 oz glass of hard liquor (44 mL). Lifestyle  Take daily care of your teeth and gums.  Stay active. Exercise for at least 30 minutes on 5 or  more days each week.  Do not use any products that contain nicotine or tobacco, such as cigarettes, e-cigarettes, and chewing tobacco. If you need help quitting, ask your health care provider.  If you are sexually active, practice safe sex. Use a condom or other form of protection to prevent STIs (sexually transmitted infections). What's next?  Go to your health care provider once a year for a well check visit.  Ask your health care provider how often you should have your eyes and teeth checked.  Stay up to date on all vaccines. This information is not intended to replace advice given to you by your health care provider. Make sure you discuss any questions you have with your health care provider. Document Revised: 08/13/2018 Document Reviewed: 08/13/2018 Elsevier Patient Education  2020 [employer].

## 2020-06-08 NOTE — Progress Notes (Signed)
Male physical   Impression and Recommendations:    1. Healthcare maintenance   2. Need for influenza vaccination   3. Need for Tdap vaccination      1) Anticipatory Guidance: Discussed skin CA prevention and sunscreen when outside along with skin surveillance; eating a balanced and modest diet; physical activity at least 25 minutes per day or minimum of 150 min/ week moderate to intense activity.  2) Immunizations / Screenings / Labs:   All immunizations are up-to-date per recommendations or will be updated today if pt allows.    - Patient understands with dental and vision screens they will schedule independently.  - Will obtain CBC, CMP, HgA1c, Lipid panel, and TSH when fasting. - Placed orders for Tdap and Influenza vaccine  3) Weight:  BMI meaning discussed with patient.  Discussed goal to improve diet habits to improve overall feelings of well being and objective health data. Improve nutrient density of diet through increasing intake of fruits and vegetables and decreasing saturated fats, white flour products and refined sugars.   4) Healthcare Maintenance: -Recommend to avoid substance use and vaping. -Discussed safe sex practices. -Follow a Mediterranean diet and stay active. -Patient denies depressive symptoms and states has more anxiety related to work. Recommend relaxation techniques and if wants to pursue alternative management options recommend to follow up. -Schedule lab visit for FBW. -Follow up in 1 year for CPE or sooner if needed   Orders Placed This Encounter  Procedures   C. trachomatis/N. gonorrhoeae RNA    Standing Status:   Future    Standing Expiration Date:   07/09/2020   Tdap vaccine greater than or equal to 7yo IM   Flu Vaccine QUAD 6+ mos PF IM (Fluarix Quad PF)   CBC    Standing Status:   Future    Standing Expiration Date:   07/09/2020   Comprehensive metabolic panel    Standing Status:   Future    Standing Expiration Date:    07/09/2020    Order Specific Question:   Has the patient fasted?    Answer:   Yes   Hemoglobin A1c    Standing Status:   Future    Standing Expiration Date:   07/09/2020   TSH    Standing Status:   Future    Standing Expiration Date:   07/09/2020   Lipid panel    Standing Status:   Future    Standing Expiration Date:   07/09/2020    Order Specific Question:   Has the patient fasted?    Answer:   Yes   Hepatitis C Antibody    Standing Status:   Future    Standing Expiration Date:   07/09/2020    No orders of the defined types were placed in this encounter.    Return in about 1 year (around 06/08/2021) for CPE, come for fasting labs in 1-4 wks. Johney Maine side effects, risk and benefits, and alternatives of medications discussed with patient.  Patient is aware that all medications have potential side effects and we are unable to predict every side effect or drug-drug interaction that may occur.  Expresses verbal understanding and consents to current therapy plan and treatment regimen.  Please see AVS handed out to patient at the end of our visit for further patient instructions/ counseling done pertaining to today's office visit.     Subjective:        CC: CPE   HPI: Dillon Belt  Carrillo is a 24 y.o. male who presents to [place] at [facility] today for a yearly health maintenance exam.     Health Maintenance Summary  - Reviewed and updated, unless pt declines services.  Tobacco History Reviewed:  Yes, vaping Alcohol / drug use:   Occasionally / smokes "weed every 3 weeks" Exercise Habits:   No exercise regimen Male history: STD concerns:   none Additional penile/ urinary concerns: none    Immunization History  Administered Date(s) Administered   DTaP 07/12/1998, 08/29/1998, 11/24/1998, 10/09/1999, 11/02/2002   HPV Quadrivalent 03/02/2010, 05/04/2010, 02/24/2014   Hepatitis A, Ped/Adol-2 Dose 02/07/2006, 05/02/2008   Hepatitis B,  ped/adol 11/24/1998, 10/09/1999, 10/12/2001   HiB (PRP-OMP) 07/12/1998, 08/29/1998, 10/09/1999, 06/30/2000   IPV 07/12/1998, 08/29/1998, 10/09/1999, 11/02/2002   Influenza,inj,Quad PF,6+ Mos 06/08/2020   MMR 10/09/1999, 11/02/2002   Meningococcal Conjugate 03/02/2010   Pneumococcal-Unspecified 10/09/1999   Tdap 03/02/2010, 06/08/2020   Varicella 10/09/1999, 02/07/2006     Health Maintenance  Topic Date Due   Hepatitis C Screening  Never done   COVID-19 Vaccine (1) Never done   TETANUS/TDAP  06/08/2030   INFLUENZA VACCINE  Completed   HIV Screening  Completed       Wt Readings from Last 3 Encounters:  06/08/20 159 lb 8 oz (72.3 kg)  03/14/20 168 lb (76.2 kg)  01/19/18 195 lb 1.6 oz (88.5 kg) (90 %, Z= 1.30)*   * Growth percentiles are based on CDC (Boys, 2-20 Years) data.   BP Readings from Last 3 Encounters:  06/08/20 92/60  03/15/20 (!) 143/92  01/19/18 (!) 97/59   Pulse Readings from Last 3 Encounters:  06/08/20 68  03/15/20 64  01/19/18 91    Patient Active Problem List   Diagnosis Date Noted   Appendicitis 03/14/2020   Traumatic dislocation of finger 07/19/2019   Acute bronchitis 01/19/2018   Epigastric abdominal pain 12/02/2017   RUQ pain 12/02/2017   Screening for chlamydial disease 11/13/2017   Healthcare maintenance 11/13/2017    Past Medical History:  Diagnosis Date   Clavicle fracture    left   Concussion     Past Surgical History:  Procedure Laterality Date   CIRCUMCISION     LAPAROSCOPIC APPENDECTOMY N/A 03/15/2020   Procedure: APPENDECTOMY LAPAROSCOPIC;  Surgeon: Jules Husbands, MD;  Location: [facility] ORS;  Service: General;  Laterality: N/A;    Family History  Problem Relation Age of Onset   Hypertension Mother    Healthy Father    Healthy Sister    Healthy Brother    Healthy Brother     Social History   Substance and Sexual Activity  Drug Use Yes   Types: Marijuana  ,  Social History    Substance and Sexual Activity  Alcohol Use Yes   Comment: every couple of months  ,  Social History   Tobacco Use  Smoking Status Current Some Day Smoker   Types: E-cigarettes  Smokeless Tobacco Never Used  ,  Social History   Substance and Sexual Activity  Sexual Activity Not Currently    Patient's Medications  New Prescriptions   No medications on file  Previous Medications   MULTIPLE VITAMIN (MULTIVITAMIN) TABLET    Take 1 tablet by mouth daily.  Modified Medications   No medications on file  Discontinued Medications   HYDROCODONE-ACETAMINOPHEN (NORCO/VICODIN) 5-325 MG TABLET    Take 1-2 tablets by mouth every 6 (six) hours as needed for moderate pain.    Penicillins  Review  of Systems: General:   Denies fever, chills, unexplained weight loss.  Optho/Auditory:   Denies visual changes, blurred vision/LOV Respiratory:   Denies SOB, DOE more than baseline levels.   Cardiovascular:   Denies chest pain, palpitations, new onset peripheral edema  Gastrointestinal:   Denies nausea, vomiting, diarrhea.  Genitourinary: Denies dysuria, freq/ urgency, flank pain or discharge from genitals.  Endocrine:     Denies hot or cold intolerance, polyuria, polydipsia. Musculoskeletal:   Denies unexplained myalgias, joint swelling, unexplained arthralgias, gait problems.  Skin:  Denies rash, suspicious lesions Neurological:     Denies dizziness, unexplained weakness, numbness  Psychiatric/Behavioral:   Denies mood changes, suicidal or homicidal ideations, hallucinations    Objective:     Blood pressure 92/60, pulse 68, height _0  (1.778 m), weight 159 lb 8 oz (72.3 kg), SpO2 99 %. Body mass index is 22.89 kg/m. General Appearance:    Alert, cooperative, no distress, appears stated age  Head:    Normocephalic, without obvious abnormality, atraumatic  Eyes:    PERRL, conjunctiva/corneas clear, EOM's intact, both eyes  Ears:    Normal TM's and external ear canals, both ears   Nose:   Nares normal, septum midline, mucosa normal, no drainage    or sinus tenderness  Throat:   Lips w/o lesion, mucosa moist, and tongue normal; teeth and gums normal  Neck:   Supple, symmetrical, trachea midline, no adenopathy;    thyroid:  no enlargement/tenderness/nodules;  Back:     Symmetric, no curvature, ROM normal, no CVA tenderness  Lungs:     Clear to auscultation bilaterally, respirations unlabored, no  Wh/ R/ R  Chest Wall:    No tenderness or gross deformity; normal excursion   Heart:    Regular rate and rhythm, S1 and S2 normal, no murmur  Abdomen:     Soft, non-tender, bowel sounds active all four quadrants, No  G/R/R, no masses, no organomegaly  Genitalia:   Declined. No concerns/sxs.  Rectal:   Declined. No concerns/sxs.  Extremities:   Extremities normal, atraumatic, no cyanosis or gross edema  Pulses:   Normal  Skin:   Warm, dry, Skin color, texture, turgor normal, no obvious rashes or lesions  M-Sk:   Ambulates * 4 w/o difficulty, no gross deformities, tone WNL  Neurologic:   CNII-XII grossly intact, normal strength, sensation and reflexes    Throughout Psych:  No HI/SI, judgement and insight good, Euthymic mood.

## 2020-06-13 ENCOUNTER — Other Ambulatory Visit: Payer: Self-pay | Admitting: Physician Assistant

## 2020-06-13 DIAGNOSIS — Z0000 Encounter for general adult medical examination without abnormal findings: Secondary | ICD-10-CM

## 2020-06-15 ENCOUNTER — Other Ambulatory Visit: Payer: No Typology Code available for payment source

## 2020-06-20 ENCOUNTER — Other Ambulatory Visit: Payer: Self-pay

## 2020-06-20 ENCOUNTER — Other Ambulatory Visit: Payer: No Typology Code available for payment source

## 2020-06-20 DIAGNOSIS — Z0000 Encounter for general adult medical examination without abnormal findings: Secondary | ICD-10-CM

## 2020-06-20 DIAGNOSIS — Z118 Encounter for screening for other infectious and parasitic diseases: Secondary | ICD-10-CM

## 2020-06-20 NOTE — Addendum Note (Signed)
Addended by: Stan Head on: 06/20/2020 09:07 AM   Modules accepted: Orders

## 2020-06-21 LAB — CBC
Hematocrit: 36.8 % — ABNORMAL LOW (ref 37.5–51.0)
Hemoglobin: 13.1 g/dL (ref 13.0–17.7)
MCH: 32.8 pg (ref 26.6–33.0)
MCHC: 35.6 g/dL (ref 31.5–35.7)
MCV: 92 fL (ref 79–97)
Platelets: 181 10*3/uL (ref 150–450)
RBC: 3.99 x10E6/uL — ABNORMAL LOW (ref 4.14–5.80)
RDW: 13 % (ref 11.6–15.4)
WBC: 5.2 10*3/uL (ref 3.4–10.8)

## 2020-06-21 LAB — LIPID PANEL
Chol/HDL Ratio: 3.3 ratio (ref 0.0–5.0)
Cholesterol, Total: 144 mg/dL (ref 100–199)
HDL: 43 mg/dL (ref >39)
LDL Chol Calc (NIH): 89 mg/dL (ref 0–99)
Triglycerides: 60 mg/dL (ref 0–149)
VLDL Cholesterol Cal: 12 mg/dL (ref 5–40)

## 2020-06-21 LAB — COMPREHENSIVE METABOLIC PANEL
ALT: 13 IU/L (ref 0–44)
AST: 11 IU/L (ref 0–40)
Albumin/Globulin Ratio: 2 (ref 1.2–2.2)
Albumin: 4.5 g/dL (ref 4.1–5.2)
Alkaline Phosphatase: 50 IU/L (ref 44–121)
BUN/Creatinine Ratio: 16 (ref 9–20)
BUN: 15 mg/dL (ref 6–20)
Bilirubin Total: 0.5 mg/dL (ref 0.0–1.2)
CO2: 27 mmol/L (ref 20–29)
Calcium: 9.5 mg/dL (ref 8.7–10.2)
Chloride: 102 mmol/L (ref 96–106)
Creatinine, Ser: 0.91 mg/dL (ref 0.76–1.27)
GFR calc Af Amer: 138 mL/min/{1.73_m2} (ref >59)
GFR calc non Af Amer: 119 mL/min/{1.73_m2} (ref >59)
Globulin, Total: 2.2 g/dL (ref 1.5–4.5)
Glucose: 88 mg/dL (ref 65–99)
Potassium: 4 mmol/L (ref 3.5–5.2)
Sodium: 139 mmol/L (ref 134–144)
Total Protein: 6.7 g/dL (ref 6.0–8.5)

## 2020-06-21 LAB — TSH: TSH: 1.57 u[IU]/mL (ref 0.450–4.500)

## 2020-06-21 LAB — HEPATITIS C ANTIBODY: Hep C Virus Ab: <0.1 s/co ratio (ref 0.0–0.9)

## 2020-06-21 LAB — HEMOGLOBIN A1C
Est. average glucose Bld gHb Est-mCnc: 100 mg/dL
Hgb A1c MFr Bld: 5.1 % (ref 4.8–5.6)

## 2020-06-23 LAB — CHLAMYDIA/GONOCOCCUS/TRICHOMONAS, NAA
Chlamydia by NAA: NEGATIVE
Gonococcus by NAA: NEGATIVE
Trich vag by NAA: NEGATIVE

## 2021-04-09 ENCOUNTER — Other Ambulatory Visit: Payer: Self-pay

## 2021-04-09 ENCOUNTER — Ambulatory Visit
Admission: EM | Admit: 2021-04-09 | Discharge: 2021-04-09 | Disposition: A | Discharge status: Home / Self Care | Payer: 59 | Attending: Emergency Medicine | Admitting: Emergency Medicine

## 2021-04-09 ENCOUNTER — Encounter: Payer: Self-pay | Admitting: Emergency Medicine

## 2021-04-09 DIAGNOSIS — J029 Acute pharyngitis, unspecified: Secondary | ICD-10-CM | POA: Diagnosis not present

## 2021-04-09 LAB — POCT RAPID STREP A (OFFICE): Rapid Strep A Screen: NEGATIVE

## 2021-04-09 MED ORDER — LIDOCAINE VISCOUS HCL 2 % MT SOLN: 15.0000 mL | OROMUCOSAL | 0 refills | Status: AC | PRN

## 2021-04-09 NOTE — Discharge Instructions (Addendum)
Can use 15 mL of solution, gargle then spit, every 4 hours as needed for comfort  Can try salt water gargles, hot liquids and throat lozenges for comfort

## 2021-04-09 NOTE — ED Triage Notes (Signed)
Pt here for sore throat x 5 days with chills

## 2021-04-09 NOTE — ED Provider Notes (Signed)
[facility]    CSN: [id number] Arrival date & time: 04/09/21  1412      History   Chief Complaint Chief Complaint  Patient presents with   Sore Throat    HPI Dillon Carrillo is a 25 y.o. male.   Patient presents with sore throat, intermittent generalized headache, and chills for 5 days. Painful to swallow but can tolerate food and liquids. Denies fever, body aches, ear pain, nasal congestion, rhinorrhea,  shortness of breath, chest pain, abdominal pain, nausea, vomiting, diarrhea. No known sick contact. Has not attempted treatment.   Past Medical History:  Diagnosis Date   Clavicle fracture    left   Concussion     Patient Active Problem List   Diagnosis Date Noted   Appendicitis 03/14/2020   Traumatic dislocation of finger 07/19/2019   Acute bronchitis 01/19/2018   Epigastric abdominal pain 12/02/2017   RUQ pain 12/02/2017   Screening for chlamydial disease 11/13/2017   Healthcare maintenance 11/13/2017    Past Surgical History:  Procedure Laterality Date   CIRCUMCISION     LAPAROSCOPIC APPENDECTOMY N/A 03/15/2020   Procedure: APPENDECTOMY LAPAROSCOPIC;  Surgeon: Leafy Ro, MD;  Location: [facility] ORS;  Service: General;  Laterality: N/A;       Home Medications    Prior to Admission medications   Medication Sig Start Date End Date Taking? Authorizing Provider  lidocaine (XYLOCAINE) 2 % solution Use as directed 15 mLs in the mouth or throat as needed for mouth pain. 04/09/21  Yes Aleiah Mohammed, Elita Boone, NP  Multiple Vitamin (MULTIVITAMIN) tablet Take 1 tablet by mouth daily.    [provider]    Family History Family History  Problem Relation Age of Onset   Hypertension Mother    Healthy Father    Healthy Sister    Healthy Brother    Healthy Brother     Social History Social History   Tobacco Use   Smoking status: Some Days    Types: E-cigarettes   Smokeless tobacco: Never  Vaping Use   Vaping Use: Never used   Substance Use Topics   Alcohol use: Yes    Comment: every couple of months   Drug use: Yes    Types: Marijuana     Allergies   Penicillins   Review of Systems Review of Systems Defer to HPI    Physical Exam Triage Vital Signs ED Triage Vitals  Enc Vitals Group     BP 04/09/21 1429 (!) 102/58     Pulse Rate 04/09/21 1429 88     Resp 04/09/21 1429 18     Temp 04/09/21 1429 99.7 F (37.6 C)     Temp Source 04/09/21 1429 Oral     SpO2 04/09/21 1429 97 %     Weight --      Height --      Head Circumference --      Peak Flow --      Pain Score 04/09/21 1430 4     Pain Loc --      Pain Edu? --      Excl. in GC? --    No data found.  Updated Vital Signs BP (!) 102/58 (BP Location: Left Arm)   Pulse 88   Temp 99.7 F (37.6 C) (Oral)   Resp 18   SpO2 97%   Visual Acuity Right Eye Distance:   Left Eye Distance:   Bilateral Distance:    Right Eye Near:   Left Eye  Near:    Bilateral Near:     Physical Exam Constitutional:      Appearance: He is well-developed and normal weight.  HENT:     Head: Normocephalic.     Right Ear: Tympanic membrane and ear canal normal.     Left Ear: Tympanic membrane and ear canal normal.     Nose: No congestion or rhinorrhea.     Mouth/Throat:     Mouth: Mucous membranes are moist.     Pharynx: Posterior oropharyngeal erythema present.     Tonsils: 1+ on the right. 1+ on the left.     Comments: Tonsill stones present Eyes:     Conjunctiva/sclera: Conjunctivae normal.     Pupils: Pupils are equal, round, and reactive to light.  Cardiovascular:     Rate and Rhythm: Normal rate and regular rhythm.     Heart sounds: Normal heart sounds.  Pulmonary:     Effort: Pulmonary effort is normal.     Breath sounds: Normal breath sounds.  Lymphadenopathy:     Cervical: Cervical adenopathy present.  Skin:    General: Skin is warm and dry.  Neurological:     Mental Status: He is alert and oriented to person, place, and time.   Psychiatric:        Mood and Affect: Mood normal.        Behavior: Behavior normal.     UC Treatments / Results  Labs (all labs ordered are listed, but only abnormal results are displayed) Labs Reviewed  CULTURE, GROUP A STREP [facility])  POCT RAPID STREP A (OFFICE)    EKG   Radiology No results found.  Procedures Procedures (including critical care time)  Medications Ordered in UC Medications - No data to display  Initial Impression / Assessment and Plan / UC Course  I have reviewed the triage vital signs and the nursing notes.  Pertinent labs & imaging results that were available during my care of the patient were reviewed by me and considered in my medical decision making (see chart for details).  Acute viral pharyngitis  Strep rapid negative Lidocaine 2% viscous 15 mL every 4 hours prn Salt water gargles, throat lozenges, hot liquids for additional comfort Declined covid test Final Clinical Impressions(s) / UC Diagnoses   Final diagnoses:  Acute viral pharyngitis     Discharge Instructions      Can use 15 mL of solution, gargle then spit, every 4 hours as needed for comfort  Can try salt water gargles, hot liquids and throat lozenges for comfort    ED Prescriptions     Medication Sig Dispense Auth. Provider   lidocaine (XYLOCAINE) 2 % solution Use as directed 15 mLs in the mouth or throat as needed for mouth pain. 100 mL Valinda Hoar, NP      PDMP not reviewed this encounter.   Valinda Hoar, NP 04/09/21 1526

## 2021-04-12 LAB — CULTURE, GROUP A STREP (THRC)

## 2021-04-26 ENCOUNTER — Other Ambulatory Visit: Payer: Self-pay

## 2021-04-26 MED ORDER — IBUPROFEN 600 MG PO TABS
ORAL_TABLET | ORAL | 1 refills | Status: AC
  Filled 2021-04-26: qty 20, 5d supply, fill #0

## 2021-04-26 MED ORDER — HYDROCODONE-ACETAMINOPHEN 5-325 MG PO TABS
1.0000 | ORAL_TABLET | Freq: Four times a day (QID) | ORAL | 0 refills | Status: AC | PRN
Start: 2021-04-26 — End: ?
  Filled 2021-04-26: qty 8, 2d supply, fill #0

## 2021-04-30 ENCOUNTER — Other Ambulatory Visit: Payer: Self-pay

## 2021-09-24 ENCOUNTER — Other Ambulatory Visit: Payer: Self-pay
